# Patient Record
Sex: Female | Born: 1935 | Race: White | Hispanic: No | Smoking: Never smoker
Health system: Southern US, Community
[De-identification: ages and names within clinical notes are randomized; demographics above are authoritative.]

## PROBLEM LIST (undated history)

## (undated) DIAGNOSIS — F0390 Unspecified dementia without behavioral disturbance: Secondary | ICD-10-CM

---

## 2004-11-25 ENCOUNTER — Ambulatory Visit: Payer: Self-pay | Admitting: Internal Medicine

## 2005-01-21 ENCOUNTER — Encounter: Payer: Self-pay | Admitting: Internal Medicine

## 2005-01-31 ENCOUNTER — Encounter: Payer: Self-pay | Admitting: Internal Medicine

## 2005-03-03 ENCOUNTER — Encounter: Payer: Self-pay | Admitting: Internal Medicine

## 2005-04-03 ENCOUNTER — Encounter: Payer: Self-pay | Admitting: Internal Medicine

## 2007-06-27 ENCOUNTER — Other Ambulatory Visit: Payer: Self-pay

## 2007-06-27 ENCOUNTER — Emergency Department: Payer: Self-pay | Admitting: Emergency Medicine

## 2007-07-08 ENCOUNTER — Ambulatory Visit: Payer: Self-pay

## 2007-09-22 ENCOUNTER — Other Ambulatory Visit: Payer: Self-pay

## 2007-09-22 ENCOUNTER — Emergency Department: Payer: Self-pay | Admitting: Emergency Medicine

## 2008-01-05 ENCOUNTER — Ambulatory Visit: Payer: Self-pay | Admitting: Surgery

## 2008-01-11 ENCOUNTER — Ambulatory Visit: Payer: Self-pay | Admitting: Surgery

## 2008-03-02 ENCOUNTER — Other Ambulatory Visit: Payer: Self-pay

## 2008-03-02 ENCOUNTER — Emergency Department: Payer: Self-pay | Admitting: Emergency Medicine

## 2008-03-31 ENCOUNTER — Other Ambulatory Visit: Payer: Self-pay

## 2008-03-31 ENCOUNTER — Emergency Department: Payer: Self-pay | Admitting: Emergency Medicine

## 2014-09-03 ENCOUNTER — Ambulatory Visit: Payer: Self-pay | Admitting: Internal Medicine

## 2014-09-27 ENCOUNTER — Inpatient Hospital Stay: Payer: Self-pay | Admitting: Internal Medicine

## 2014-10-02 ENCOUNTER — Ambulatory Visit
Admit: 2014-10-02 | Disposition: A | Discharge status: Home / Self Care | Payer: Self-pay | Attending: Internal Medicine | Admitting: Internal Medicine

## 2014-12-02 NOTE — H&P (Signed)
PATIENT NAME:  Sherry Cherry, Sherry Cherry MR#:  [id number] DATE OF BIRTH:  January 08, 1917  DATE OF ADMISSION:  09/27/2014  PRIMARY CARE PHYSICIAN: Bethann Punches, MD     HOSPITAL COURSE: The patient is a 81 year old Caucasian female with past medical history significant for history of dementia, who presents to the hospital with complaints of low back pains. The patient herself has difficulty remembering a course of events; however, according to medical records, the patient was brought to the Emergency Room by her daughter who she lives with.  The patient has been having decreased activity level over the past 2 days and mostly spending time in bed because of significant low back pains.  In the Emergency Room, she was able to point out the lumbosacral area where the pain is.  A CT scan done in the Emergency Room showed old T12 fracture however, no significant other abnormalities. Her laboratories revealed pyuria, questionable urinary tract infection and CT scan of abdomen showed possible common bile duct stone with enlargement of intrahepatic ducts.  On measuring, they both measured 9 mm in dimension.  The patient's lab studies however did not show any significant abnormalities, normal liver function tests and lipase level is normal at 110.  Hospitalist services were contacted for admission.   PAST MEDICAL HISTORY: Significant for history of dementia, history of fibrocystic breast disease, constipation, history of arthritis, osteoarthritis,   PAST SURGICAL HISTORY: Right cataract removal in 1999, breast lump surgery in 1984, hernia repair in 2003, bilateral hernia repair in May 2004.   FAMILY HISTORY: No known history of coronary artery disease or diabetes.   ALLERGIES: No known drug allergies.   SOCIAL HISTORY: No smoking, alcohol abuse or illicit drug abuse. Lives at home with her daughter.   MEDICATIONS: According to medical records, the patient is on Acetaminopohen 325 mg 2 tablets orally as needed. Aricept 10 mg  p.o. at bedtime.   REVIEW OF SYSTEMS:  Difficult to obtain as the patient is demented and does not remember much, however denies any shortness of breath, chest pain, and abdominal discomfort, except in the lower part of abdomen and low back pains on.   On arrival to the hospital:  VITAL SIGNS: Temperature was 98.5, pulse was 71, respiration was 18, blood pressure 167/80, saturation was 98% on room air.  GENERAL: This is a well-developed, well-nourished Caucasian female in no significant distress, lying on the stretcher.  HEENT: Pupils are equal, reactive to light, extraocular muscles intact. No icterus or conjunctivitis, and had difficulty hearing. No pharyngeal erythema. Mucosa is moist.  NECK: No masses, nontender. Thyroid is not enlarged. No adenopathy. No JVD or carotid bruits bilaterally. Full range of motion.  LUNGS: Clear to auscultation anteriorly, no rales, rhonchi, diminished breath sounds or wheezing. No labored inspirations, increased effort, dullness to percussion, or overt respiratory distress.  CARDIOVASCULAR: S1, S2 appreciated. Rhythm is regular. PMI not lateralized. Chest is nontender to palpation. 1+ pedal edema. No lower extremity calf tenderness, or cyanosis.  ABDOMEN: Soft, minimal discomfort is noted in the right upper quadrant, but I am not absolutely sure about the discomfort since the patient is responding to simple abdominal pressure, grimacing intermittently.  There is no muscle guarding and no significant discomfort on palpation.  Some discomfort  was noted in the right lower quadrant as well as suprapubic, but no hepatosplenomegaly or masses were noted.  Bowel sounds were present.    RECTAL: Deferred.  MUSCLE STRENGTH: Able to move lower extremities; however, has difficulty even turning herself  in the bed from 1 side to the other side. She has significant discomfort and pain on palpation of her lumbosacral spine, especially in the lower part and lumbosacral area.  No  cyanosis, degenerative joint disease. Not able to assess for kyphosis.  SKIN: Did not reveal any rashes, lesions, erythema, nodularity, or induration. It was warm and dry to palpation.  LYMPHATIC: No adenopathy in the cervical region.  NEUROLOGICAL: Cranial nerves grossly intact. Sensory is intact. No dysarthria or aphasia. The patient is alert, oriented to person and place, cooperative.  Memory is significantly impaired but no agitation or confusion were noted. No depression.   LABORATORY DATA: Emergency Room showed BUN of 19, otherwise unremarkable. Albumin level of 3.0, otherwise liver enzymes were unremarkable. Lipase level was normal at 110. CBC within normal limits. White blood cell count level of 6.4, hemoglobin 13.5, platelet count 219,000. Neutrophil count is not checked.  The patient's urinalysis, yellow hazy urine, negative for glucose, bilirubin or ketones. Specific gravity 1.009, pH was 7.0, 2+ blood, negative for protein, no nitrites, 3+ leukocytes esterase, 25 red blood cells, 3 white blood cells, 1+ bacteria, 1 epithelial cell was noted.    RADIOLOGIC STUDIES: Chest x-ray was not done. Lumbar spine AP and lateral, 09/27/2014 revealed degenerative changes, mild wedge compression deformity in T12, vertebral body was noted which most likely represents old fracture, but the patient is symptomatic in this area.  CT scan would be recommended to evaluate for possible acute fracture.  The patient had CT scan of lumbar spine without contrast 09/27/2014, which showed multilevel degenerative changes and chronic compression deformity in T12.  MRI was recommended.  CT of abdomen and pelvis without contrast 09/27/2014 showed no renal calculi or septic changes. Rounded density was in the midportion of the common bile duct with associated biliary ductal dilatation noted which is consistent with common bile duct stone, which measured approximately 9 mm in dimension.   Some smaller stones were also noted in the  more distal common bile duct better visualized on coronal imaging.  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  Correlation of this patient's white blood cell count was recommended.   ASSESSMENT AND PLAN:  1.  Low back pain.  Admit the patient to medical floor and start her on Lidoderm, Norco, and morphine as needed.  We will get physical therapist's evaluation and get care management evaluation.  This patient is not able to take care of her herself.  She may need to be send to rehabilitation facility for short term rehabilitation.  2.  Urinary tract infection, suspected. We will initiate the patient on Rocephin IV.  We will get cultures.  3.  Common bile duct stone. Get gastrointestinal involved for recommendations.  Questionable ERCP.   4.  Dementia. Continue Aricept. 5.  Questionable diverticulitis. Continue the patient on Rocephin for now. The patient otherwise is asymptomatic.  6.  Mild dehydration, based on minimaly elevated BUN.  We will continue the patient on IV fluids.   TIME SPENT: 50 minutes on the patient.      ____________________________ Katharina Caperima Rekha Hobbins, MD rv:DT D: 09/27/2014 16:08:55 ET T: 09/27/2014 17:00:17 ET JOB#: [id number]  cc: Katharina Caperima Kenadi Miltner, MD, <Dictator> Danella PentonMark F. Miller, MD  Asbury Hair MD ELECTRONICALLY SIGNED 11/04/2014 12:57

## 2014-12-02 NOTE — Consult Note (Signed)
Pt came to ER for back/side pain lower right, far away from her liver.  Her CT scan showed a gall stone in the CBD and possibly some other small ones,  She has no symptoms suggsting any medical illness related to these stones.  No vomiting, nausea, RUQ pain and her LFT's are normal.  I briefly discussed this patient with Dr. Servando SnareWohl and at the age of 81 there would have to be very strong evidence of acute illness from these stones to justify an ERCP on her.  These may have been there for years.  We do not recommend any invasive manuvers at this time but would keep an eye on her LFT's and lipase while she is here.  I have discussed this with the patient and she is in complete agreement.  Please reconsult if clinical situation changes.  Electronic Signatures: Scot JunElliott, Iain Sawchuk T (MD)  (Signed on 25-Feb-16 18:56)  Authored  Last Updated: 25-Feb-16 18:56 by Scot JunElliott, Elgie Maziarz T (MD)

## 2014-12-02 NOTE — Discharge Summary (Signed)
PATIENT NAME:  Sherry Cherry, Sherry Cherry MR#:  [id number] DATE OF BIRTH:  04-26-1917  DATE OF ADMISSION:  09/27/2014 DATE OF DISCHARGE:  10/01/2014  DISCHARGE DIAGNOSES: 1.  T12 compression fracture, vertebral.  2.  Bile duct stone.  3.  Dementia, moderate, Alzheimer's type.  4.  Urinary tract infection.  DISCHARGE MEDICATIONS: Aricept 10 mg at bedtime, Tylenol 650 mg q. 4 p.r.n. pain,  Norco 10/325 one-half to 1 tab q. 6 hours p.r.n., lidocaine patch 5% topical daily, Cipro 250 mg b.i.Cherry. x1 week.   REASON FOR ADMISSION: A 81 year old female who presents with significant back pain, UTI. Please see H and P for HPI, past medical history and physical exam.   HOSPITAL COURSE: The patient was admitted, found to have a T12 vertebral compression fracture, as well as questionable CBD stone by CT. Her LFTs were normal. Her white count was normal. There was no sign of any obstructive jaundice or any need for GI intervention. Urine grew out E. coli and she was treated with p.o. Cipro. Her back pain was stabilized with the rare Norco use or just Tylenol and with physical therapy. With her progressive weakness, she will be going to skilled nursing.   ____________________________ Danella PentonMark F. Chizaram Latino, MD mfm:sb Cherry: 10/01/2014 09:36:14 ET T: 10/01/2014 09:42:52 ET JOB#: [id number]  cc: Danella PentonMark F. Anjela Cassara, MD, <Dictator> Meelah Tallo Sherlene ShamsF Tasharra Nodine MD ELECTRONICALLY SIGNED 10/02/2014 8:22

## 2014-12-02 NOTE — Consult Note (Signed)
No change in LFT's, no indication of any harmful effect of the probable stone.  Would repeat LFT's no more often than every 3 days while here then none at all as out patient unless clinical situation warrants it.  I will sign off, reconsult GI if needed.  Electronic Signatures: Scot JunElliott, Robert T (MD)  (Signed on 26-Feb-16 17:53)  Authored  Last Updated: 26-Feb-16 17:53 by Scot JunElliott, Robert T (MD)

## 2014-12-02 NOTE — Consult Note (Signed)
Chief Complaint:  Subjective/Chief Complaint Patient continues to c/o lower back pain, worse with standing and transfers   VITAL SIGNS/ANCILLARY NOTES: **Vital Signs.:   27-Feb-16 04:43  Temperature Temperature (F) 98.5  Pulse Pulse 65  Systolic BP Systolic BP 111  Diastolic BP (mmHg) Diastolic BP (mmHg) 64   Brief Assessment:  GEN Pleasant elderly female in NAD   Respiratory normal resp effort   Additional Physical Exam Mild focal tenderness over central lower back  NV intact to both lower extremities   Assessment/Plan:  Assessment/Plan:  Assessment LBP with lumbar spondylosis   Plan Mobilize with PT Treat UTI if present Manage pain with appropriate meds Consider MRI of L-spine if pain persists   Electronic Signatures: Derald MacleodPoggi, Jeffrey (MD)  (Signed 27-Feb-16 08:34)  Authored: Chief Complaint, VITAL SIGNS/ANCILLARY NOTES, Brief Assessment, Assessment/Plan   Last Updated: 27-Feb-16 08:34 by Derald MacleodPoggi, Jeffrey (MD)

## 2014-12-02 NOTE — Consult Note (Signed)
Sherry Cherry NAME:  Sherry Sherry Cherry, Sherry Sherry Cherry MR#:  [id number] DATE OF BIRTH:  05/01/1917  DATE OF CONSULTATION:  09/28/2014  REFERRING PHYSICIAN:  Daniel NonesBert Klein, MD CONSULTING PHYSICIAN:  Maryagnes AmosJ. Jeffrey Kainalu Heggs, MD  REASON FOR CONSULTATION: I have been asked by Dr. Graciela HusbandsKlein to evaluate this unfortunate woman for lower back pain.   HISTORY OF PRESENT ILLNESS: Briefly, she is a 81 year old female with history of dementia who presented to [facility] hospital complaining of lower back pain yesterday. She apparently lives with her daughter and is not a good historian herself. She cannot recall any specific injury or fall. Sherry Sherry Cherry notes Sherry pain is localized to Sherry lower back region. She denies any numbness or paraesthesias of either lower extremity.   PHYSICAL EXAMINATION: We have a pleasant, elderly female sitting comfortably in a chair while eating lunch. She is appropriately responsive to questions, but does not recall any past events so cannot provide any accurate history as to whether she may have fallen or not. Orthopedic examination is limited to her back and lower extremity. Skin inspection of her back is unremarkable. She has mild tenderness to palpation over Sherry central lumbar region, but has no tenderness over Sherry thoracolumbar junction, over Sherry paraspinal muscles, or over either SI joint. Her gait is not assessed nor is her ability to stand up from a seated position, given Sherry fact that she is in a chair and eating lunch. She demonstrates good strength with ankle dorsiflexion, plantar flexion, inversion, and eversion. She also has good toe extensor strength. She has intact sensation to light touch to both lower extremities and feet. She has good capillary refill to both feet.   DIAGNOSTIC DATA: X-rays of her lumbar spine are available for review. These films demonstrate what appears to be an old mild compression fracture at T12, as well as significant degenerative changes at L1-L2 and lesser degenerative changes at Sherry  remaining levels. There is a mild retrolisthesis of L2 on L3, and a mild anterolisthesis of L3 on L4 and of L4 on L5. No lytic lesions or fractures are identified.   A CT scan of Sherry abdomen also is available for review. Sherry CT scan demonstrated similar findings pertaining to her lumbar spine as did Sherry plain radiographs.   IMPRESSION: Lumbar degenerative disk disease with no apparent acute compression fracture or other orthopedic abnormality.   PLAN: Sherry treatment options have been discussed with Sherry Sherry Cherry. At this point, I wonder whether her back pain may be due to a urinary tract infection. She also does have some chronic lumbar degenerative changes. I would consider mobilizing her with physical therapy and see how well she tolerates this. If she is unable to tolerate physical therapy despite adequate treatment of her urinary tract infection, then a MRI scan of her lumbar spine would be indicated to evaluate for an occult fracture.   I thank you for asking me to participate in Sherry care of this most pleasant woman. I will be happy to follow her with you.  ____________________________ J. Derald MacleodJeffrey Charistopher Rumble, MD jjp:sb Cherry: 09/28/2014 13:22:47 ET T: 09/28/2014 16:40:49 ET JOB#: [id number]  cc: Maryagnes AmosJ. Jeffrey Latroy Gaymon, MD, <Dictator> Maryagnes AmosJ. JEFFREY Takela Varden MD ELECTRONICALLY SIGNED 10/02/2014 11:45

## 2014-12-02 NOTE — Consult Note (Signed)
Chief Complaint:  Subjective/Chief Complaint Patient sleeping comfortably, so not awakened. Has not been officially evauated by PT yet, but nursing staff notes that she is quite difficult to mobilize for transfers due to LBP.   Assessment/Plan:  Assessment/Plan:  Assessment LBP with underlying spondylosis and T12 compression fracture of indeterminate age.   Plan Awaiting PT evaluation (probably Monday AM). Consider MRI of L-spine if pain continues to inhibit mobilization of patient.   Electronic Signatures: Derald MacleodPoggi, Jeffrey (MD)  (Signed 28-Feb-16 11:38)  Authored: Chief Complaint, Assessment/Plan   Last Updated: 28-Feb-16 11:38 by Derald MacleodPoggi, Jeffrey (MD)

## 2014-12-02 NOTE — Consult Note (Signed)
   Comments   Telephone call from patient's daughter, Warren DanesCarol Giuliani.stated she likely won't be able to continue to care the patient at home.  Okey RegalCarol works during the day.  A private caregiver stays with the patient 4 hours a day five days a week while Okey RegalCarol is at work.  Okey RegalCarol said that if the patient needs a higher level of care, the patient may need to go to a rehab facility. stated she is the POA.  She said her deceased brother-in-law was the HCPOA.  A new HCPOA was not written after his death.  Okey RegalCarol stated she believes the patient had a formal Living Will.  She will try to find  the Living Will and bring a copy to the hospital for the chart.  Discussed that the patient will continue to receive medications for her pain over the weekend.  The NP or SW will touch base with Okey Regalarol on Monday. cell phone number: [phone]   She is unable to receive a cell phone signal when she is at home this weekend.  Her home number is [phone] and the best contact number over the weekend.  Electronic Signatures: Phifer, Harriett SineNancy (MD)  (Signed [phone]-Feb-16 16:47)  Authored: Palliative Care Reginal Lutesowe, Jabir Dahlem L (NP)  (Signed 26-Feb-16 16:35)  Authored: Palliative Care   Last Updated: 26-Feb-16 16:47 by Phifer, Harriett SineNancy (MD)

## 2014-12-02 NOTE — Consult Note (Signed)
PATIENT NAME:  Sherry Cherry, Sherry Cherry MR#:  [id number] DATE OF BIRTH:  05/05/1917  DATE OF CONSULTATION:  09/27/2014  REFERRING PHYSICIAN:   CONSULTING PHYSICIAN:  Scot Junobert T. Elliott, MD  HISTORY OF PRESENT ILLNESS: The patient is a 81 year old white female who presented to the hospital complaining of low back pain. She has had decreased activity level over the last 2 days, spending most of her time in bed because of the back pain. Ah CAT scan of the abdomen was done showing old T12 fractures;  no other significant abnormalities. Her labs did show pyuria. CAT scan showed possible common bile duct stone with enlargement of the intrahepatic ducts of 9 mm. Her lipase was normal. She was admitted and GI was asked to see her.   PAST MEDICAL HISTORY: Obtained only from the patient. Family members not round at this time and also obtained from the chart. History of dementia, constipation, arthritis, previous cataract surgery, breast lump surgery, hernia repair 2003 and 2004.   FAMILY HISTORY: No known liver disease.   ALLERGIES: No known drug allergies.   SOCIAL HISTORY: Lives with her daughter.  MEDICATIONS: Aricept 10 mg at bedtime.   PHYSICAL EXAMINATION: GENERAL: Elderly white female in no acute distress.  HEENT: Sclerae anicteric. Conjunctivae negative.  HEAD: Atraumatic.  CHEST: Clear to anterior fields.  HEART: No murmurs or gallops.  ABDOMEN: No significant tenderness to palpation. No hepatosplenomegaly. No masses.   LABORATORY DATA: Shows lipase of 110. White count 6.4. Her liver functions are completely normal except albumin slightly low at 3. Total bilirubin 0.5, alkaline phosphatase 90, SGOT 26, SGPT 16. White blood count 6.4, platelet count is 219,000.   ASSESSMENT: There is no evidence that these stones are causing any pathological problems at this time. Given her absence of pain, jaundice, vomiting, fever or elevated liver functions or lipase, these stones may have been there for a  significant amount of time.   Given the absence of clinical manifestations of illness related to the stones, no intervention should be done at this time. Because of her age, she would be at significant risk for complications of an ERCP, which would have to be done under general anesthesia. If the patient manifests changes to suggest acute illness related to stones, this decision can be revisited, but at this time, would recommend simply observe the patient. She is in complete agreement. The consult was discussed with Dr. Servando SnareWohl who does ERCPs.    ____________________________ Scot Junobert T. Elliott, MD rte:dw Cherry: 09/27/2014 19:02:12 ET T: 09/27/2014 19:32:07 ET JOB#: [id number]  cc: Scot Junobert T. Elliott, MD, <Dictator> Danella PentonMark F. Miller, MD Scot JunOBERT T ELLIOTT MD ELECTRONICALLY SIGNED 10/06/2014 11:00

## 2014-12-02 NOTE — Consult Note (Signed)
Brief Consult Note: Diagnosis: LBP with underlying spondylosis.   Patient was seen by consultant.   Consult note dictated.   Recommend further assessment or treatment.   Comments: Given the patient's exam and x-ray findings, I do not feel that the T12 compressino fracture is new. Would recommend mobilizing with PT.  If patient cannot tolerate PT, then would consider MRI of lumbar spine to evaluate for occult fracture.  Thanks!.  Electronic Signatures: Derald MacleodPoggi, Jeffrey (MD)  (Signed [phone]-Feb-16 13:16)  Authored: Brief Consult Note   Last Updated: 26-Feb-16 13:16 by Derald MacleodPoggi, Jeffrey (MD)

## 2016-11-09 ENCOUNTER — Emergency Department: Payer: Medicare Other

## 2016-11-09 ENCOUNTER — Inpatient Hospital Stay
Admission: EM | Admit: 2016-11-09 | Discharge: 2016-11-13 | DRG: 065 | Disposition: A | Discharge status: Hospice | Payer: Medicare Other | Attending: Internal Medicine | Admitting: Internal Medicine

## 2016-11-09 DIAGNOSIS — F809 Developmental disorder of speech and language, unspecified: Secondary | ICD-10-CM | POA: Diagnosis not present

## 2016-11-09 DIAGNOSIS — Z66 Do not resuscitate: Secondary | ICD-10-CM | POA: Diagnosis present

## 2016-11-09 DIAGNOSIS — Y92092 Bedroom in other non-institutional residence as the place of occurrence of the external cause: Secondary | ICD-10-CM

## 2016-11-09 DIAGNOSIS — S2249XA Multiple fractures of ribs, unspecified side, initial encounter for closed fracture: Secondary | ICD-10-CM | POA: Diagnosis present

## 2016-11-09 DIAGNOSIS — I639 Cerebral infarction, unspecified: Principal | ICD-10-CM | POA: Diagnosis present

## 2016-11-09 DIAGNOSIS — Z7189 Other specified counseling: Secondary | ICD-10-CM

## 2016-11-09 DIAGNOSIS — R479 Unspecified speech disturbances: Secondary | ICD-10-CM

## 2016-11-09 DIAGNOSIS — R5383 Other fatigue: Secondary | ICD-10-CM | POA: Diagnosis present

## 2016-11-09 DIAGNOSIS — R4701 Aphasia: Secondary | ICD-10-CM | POA: Diagnosis present

## 2016-11-09 DIAGNOSIS — F0150 Vascular dementia without behavioral disturbance: Secondary | ICD-10-CM | POA: Diagnosis present

## 2016-11-09 DIAGNOSIS — J811 Chronic pulmonary edema: Secondary | ICD-10-CM | POA: Diagnosis present

## 2016-11-09 DIAGNOSIS — I739 Peripheral vascular disease, unspecified: Secondary | ICD-10-CM | POA: Diagnosis present

## 2016-11-09 DIAGNOSIS — Z79899 Other long term (current) drug therapy: Secondary | ICD-10-CM

## 2016-11-09 DIAGNOSIS — Z515 Encounter for palliative care: Secondary | ICD-10-CM | POA: Diagnosis present

## 2016-11-09 DIAGNOSIS — R29810 Facial weakness: Secondary | ICD-10-CM | POA: Diagnosis present

## 2016-11-09 DIAGNOSIS — B379 Candidiasis, unspecified: Secondary | ICD-10-CM | POA: Diagnosis present

## 2016-11-09 DIAGNOSIS — G9389 Other specified disorders of brain: Secondary | ICD-10-CM | POA: Diagnosis present

## 2016-11-09 DIAGNOSIS — W06XXXA Fall from bed, initial encounter: Secondary | ICD-10-CM | POA: Diagnosis present

## 2016-11-09 DIAGNOSIS — Z881 Allergy status to other antibiotic agents status: Secondary | ICD-10-CM

## 2016-11-09 DIAGNOSIS — I509 Heart failure, unspecified: Secondary | ICD-10-CM | POA: Diagnosis present

## 2016-11-09 DIAGNOSIS — R4182 Altered mental status, unspecified: Secondary | ICD-10-CM

## 2016-11-09 HISTORY — DX: Unspecified dementia, unspecified severity, without behavioral disturbance, psychotic disturbance, mood disturbance, and anxiety: F03.90

## 2016-11-09 LAB — COMPREHENSIVE METABOLIC PANEL
ALBUMIN: 2.8 g/dL — AB (ref 3.5–5.0)
ALK PHOS: 85 U/L (ref 38–126)
ALT: 30 U/L (ref 14–54)
ANION GAP: 6 (ref 5–15)
AST: 14 U/L — ABNORMAL LOW (ref 15–41)
BILIRUBIN TOTAL: 0.7 mg/dL (ref 0.3–1.2)
BUN: 16 mg/dL (ref 6–20)
CALCIUM: 8.3 mg/dL — AB (ref 8.9–10.3)
CO2: 23 mmol/L (ref 22–32)
Chloride: 101 mmol/L (ref 101–111)
Creatinine, Ser: 0.66 mg/dL (ref 0.44–1.00)
GFR calc Af Amer: >60 mL/min (ref >60)
GLUCOSE: 96 mg/dL (ref 65–99)
Potassium: 3.5 mmol/L (ref 3.5–5.1)
Sodium: 130 mmol/L — ABNORMAL LOW (ref 135–145)
TOTAL PROTEIN: 6.1 g/dL — AB (ref 6.5–8.1)

## 2016-11-09 LAB — CBC
HCT: 36.3 % (ref 35.0–47.0)
Hemoglobin: 12.1 g/dL (ref 12.0–16.0)
MCH: 28.3 pg (ref 26.0–34.0)
MCHC: 33.3 g/dL (ref 32.0–36.0)
MCV: 84.9 fL (ref 80.0–100.0)
Platelets: 218 10*3/uL (ref 150–440)
RBC: 4.28 MIL/uL (ref 3.80–5.20)
RDW: 16.6 % — AB (ref 11.5–14.5)
WBC: 7.7 10*3/uL (ref 3.6–11.0)

## 2016-11-09 NOTE — ED Provider Notes (Signed)
[facility] Emergency Department Provider Note  Time seen: 10:16 PM  I have reviewed the triage vital signs and the nursing notes.   HISTORY  Chief Complaint Altered Mental Status    HPI Sherry Cherry is a 83 y.o. female with a past medical history of dementia who presents to the emergency department for decreased responsiveness. Per EMS they state the patient has baseline dementia difficult following commands. They state at the nursing home tonight they noted the patient to be less responsive than normal so they sent her to the emergency department for evaluation. Currently the patient's family is here he states the patient is normally more talkative and gets agitated much more easily. They state the patient has baseline dementia and would not be able to tell them who they are or where she was even at baseline. They state at baseline she will communicate when she feels like it and at times she will not communicate because she does not feel like it. States she gets agitated very easily. For instance I tried to listen to her chest and she got agitated pulling the covers back over herself raising her arms to try to hit or defend herself. They state this is fairly normal for her. They are unaware of any specific complaints such as cough congestion vomiting or diarrhea. Patient is afebrile upon arrival. She does have old bruising to the face. Family states she fell a little over a week ago but was never taken for evaluation but was seen by the nursing home nurse.  No past medical history on file.  There are no active problems to display for this patient.   No past surgical history on file.  Prior to Admission medications   Medication Sig Start Date End Date Taking? Authorizing Provider  acetaminophen (TYLENOL) 325 MG tablet Take 325 mg by mouth every 6 (six) hours as needed.   Yes Historical Provider, MD  diclofenac sodium (VOLTAREN) 1 % GEL Apply 2 g topically at  bedtime.   Yes Historical Provider, MD  sertraline (ZOLOFT) 25 MG tablet Take 25 mg by mouth daily.   Yes Historical Provider, MD    Allergies  Allergen Reactions  . Macrobid [Nitrofurantoin Macrocrystal]     No family history on file.  Social History Social History  Substance Use Topics  . Smoking status: Not on file  . Smokeless tobacco: Not on file  . Alcohol use Not on file    Review of Systems Unable to obtain a review of systems due to significant dementia.  ____________________________________________   PHYSICAL EXAM:  VITAL SIGNS: ED Triage Vitals  Enc Vitals Group     BP 11/09/16 2135 140/83     Pulse Rate 11/09/16 2135 74     Resp 11/09/16 2135 14     Temp 11/09/16 2135 97.8 F (36.6 C)     Temp Source 11/09/16 2135 Oral     SpO2 11/09/16 2135 95 %     Weight 11/09/16 2137 122 lb 6.4 oz (55.5 kg)     Height 11/09/16 2137  (1.626 m)     Head Circumference --      Peak Flow --      Pain Score --      Pain Loc --      Pain Edu? --      Excl. in GC? --     Constitutional: Alert. Patient will open her eyes to voice but does not follow commands. She does move  all extremities. Gets agitated with examination. Family states this is fairly baseline for her. Eyes: Normal exam ENT   Head: Normocephalic and atraumatic   Mouth/Throat: Mucous membranes are moist. Cardiovascular: Normal rate, regular rhythm.  Respiratory: Normal respiratory effort without tachypnea nor retractions. Breath sounds are clear Gastrointestinal: Soft and nontender. No distention.  No reaction to abdominal palpation Musculoskeletal: Nontender with normal range of motion in all extremities. Normal range of motion in lower extremities. Neurologic:  Patient is less talkative than normal per family. Moves all extremities normally. Skin:  Skin is warm, dry and intact.   ____________________________________________   RADIOLOGY  IMPRESSION: 1. Cardiomegaly with aortic  atherosclerosis and mild interstitial edema. Trace left pleural effusion with blunting of the left lateral costophrenic angle. 2. Subtle deformity of the right lateral fifth through seventh ribs may reflect nondisplaced rib fractures possibly recent given adjacent atelectasis. These are new since the 2009 comparison.  IMPRESSION: 1. 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 2. Chronic stable ovoid hyperdensity likely representing benign calcification in the left anterior insular cortex. 3. Chronic small vessel ischemic disease of periventricular white matter with atrophy. 4. No acute intracranial abnormality.    ____________________________________________   INITIAL IMPRESSION / ASSESSMENT AND PLAN / ED COURSE  Pertinent labs & imaging results that were available during my care of the patient were reviewed by me and considered in my medical decision making (see chart for details).  The patient presents to the emergency department for decreased responsiveness. Family states she is less talkative than normal but otherwise is acting fairly normal for them. Patient gets agitated during the examination which they state is normal.  Patient does have old bruising to the right side of her face which they states from a fall greater than 1 week ago. We will obtain a CT scan of the head to rule out delayed subdural. We will check labs, urinalysis as well as a chest x-ray.  ____________________________________________   FINAL CLINICAL IMPRESSION(S) / ED DIAGNOSES  Altered mental status    Minna Antis, MD 11/13/16 1512

## 2016-11-09 NOTE — ED Triage Notes (Addendum)
Pt presents to ED 10 via EMS from [facility]; per EMS pt's baseline is severe dementia, combative, non-verbal, non-responsive, ambulatory w/ walker; VS per EMS, NSR, HR 80s, BP 136/84, CBG 106; EMS were called because pt had not ambulated for the last 45 minutes. At this time, pt is non-verbal, non-responsive to commands; pt does not appear to be in acute distress; pt does have bruising on right side of face; pt's O2 sats were noted to be in the low 90s when pt arrived, pt placed on 2L O2 via [place], O2 sats improved to 95%. Unable to obtain medical history, family history, smoking or drug use status, and allergy information. According to paperwork provided by EMS from [facility], pt is allergic to Macrobid; pt takes Diclofenac 1% gel, Mapap 325 mg tab, and Sertraline 25 mg tab; pt has DNR order as well.

## 2016-11-10 ENCOUNTER — Inpatient Hospital Stay: Payer: Medicare Other

## 2016-11-10 ENCOUNTER — Encounter: Payer: Self-pay | Admitting: Internal Medicine

## 2016-11-10 DIAGNOSIS — Z7189 Other specified counseling: Secondary | ICD-10-CM | POA: Diagnosis not present

## 2016-11-10 DIAGNOSIS — R4701 Aphasia: Secondary | ICD-10-CM | POA: Diagnosis present

## 2016-11-10 DIAGNOSIS — R5383 Other fatigue: Secondary | ICD-10-CM | POA: Diagnosis not present

## 2016-11-10 DIAGNOSIS — I639 Cerebral infarction, unspecified: Secondary | ICD-10-CM | POA: Diagnosis not present

## 2016-11-10 DIAGNOSIS — F809 Developmental disorder of speech and language, unspecified: Secondary | ICD-10-CM | POA: Diagnosis present

## 2016-11-10 DIAGNOSIS — Z881 Allergy status to other antibiotic agents status: Secondary | ICD-10-CM | POA: Diagnosis not present

## 2016-11-10 DIAGNOSIS — J811 Chronic pulmonary edema: Secondary | ICD-10-CM | POA: Diagnosis present

## 2016-11-10 DIAGNOSIS — Z79899 Other long term (current) drug therapy: Secondary | ICD-10-CM | POA: Diagnosis not present

## 2016-11-10 DIAGNOSIS — I739 Peripheral vascular disease, unspecified: Secondary | ICD-10-CM | POA: Diagnosis present

## 2016-11-10 DIAGNOSIS — B379 Candidiasis, unspecified: Secondary | ICD-10-CM | POA: Diagnosis present

## 2016-11-10 DIAGNOSIS — F0150 Vascular dementia without behavioral disturbance: Secondary | ICD-10-CM | POA: Diagnosis not present

## 2016-11-10 DIAGNOSIS — R29810 Facial weakness: Secondary | ICD-10-CM | POA: Diagnosis present

## 2016-11-10 DIAGNOSIS — W06XXXA Fall from bed, initial encounter: Secondary | ICD-10-CM | POA: Diagnosis present

## 2016-11-10 DIAGNOSIS — R4182 Altered mental status, unspecified: Secondary | ICD-10-CM | POA: Diagnosis not present

## 2016-11-10 DIAGNOSIS — Y92092 Bedroom in other non-institutional residence as the place of occurrence of the external cause: Secondary | ICD-10-CM | POA: Diagnosis not present

## 2016-11-10 DIAGNOSIS — Z66 Do not resuscitate: Secondary | ICD-10-CM | POA: Diagnosis present

## 2016-11-10 DIAGNOSIS — I509 Heart failure, unspecified: Secondary | ICD-10-CM | POA: Diagnosis present

## 2016-11-10 DIAGNOSIS — Z515 Encounter for palliative care: Secondary | ICD-10-CM | POA: Diagnosis not present

## 2016-11-10 DIAGNOSIS — S2249XA Multiple fractures of ribs, unspecified side, initial encounter for closed fracture: Secondary | ICD-10-CM | POA: Diagnosis present

## 2016-11-10 DIAGNOSIS — G9389 Other specified disorders of brain: Secondary | ICD-10-CM | POA: Diagnosis present

## 2016-11-10 LAB — LIPID PANEL
Cholesterol: 155 mg/dL (ref 0–200)
HDL: 52 mg/dL (ref >40)
LDL Cholesterol: 93 mg/dL (ref 0–99)
Total CHOL/HDL Ratio: 3 RATIO
Triglycerides: 52 mg/dL (ref <150)
VLDL: 10 mg/dL (ref 0–40)

## 2016-11-10 LAB — URINALYSIS, COMPLETE (UACMP) WITH MICROSCOPIC
BACTERIA UA: NONE SEEN
BILIRUBIN URINE: NEGATIVE
Glucose, UA: NEGATIVE mg/dL
HGB URINE DIPSTICK: NEGATIVE
KETONES UR: NEGATIVE mg/dL
LEUKOCYTES UA: NEGATIVE
NITRITE: NEGATIVE
PROTEIN: NEGATIVE mg/dL
SPECIFIC GRAVITY, URINE: 1.005 (ref 1.005–1.030)
SQUAMOUS EPITHELIAL / LPF: NONE SEEN
pH: 7 (ref 5.0–8.0)

## 2016-11-10 LAB — TSH: TSH: 2.691 u[IU]/mL (ref 0.350–4.500)

## 2016-11-10 LAB — MRSA PCR SCREENING: MRSA BY PCR: NEGATIVE

## 2016-11-10 MED ORDER — STROKE: EARLY STAGES OF RECOVERY BOOK
Freq: Once | Status: AC
  Administered 2016-11-10: 10:00:00

## 2016-11-10 MED ORDER — ONDANSETRON HCL 4 MG PO TABS: 4.0000 mg | ORAL_TABLET | Freq: Four times a day (QID) | ORAL | Status: DC | PRN

## 2016-11-10 MED ORDER — ASPIRIN 300 MG RE SUPP
300.0000 mg | Freq: Every day | RECTAL | Status: DC
  Administered 2016-11-10 – 2016-11-11 (×2): 300 mg via RECTAL
  Filled 2016-11-10 (×2): qty 1

## 2016-11-10 MED ORDER — ASPIRIN 325 MG PO TABS: 325.0000 mg | ORAL_TABLET | Freq: Every day | ORAL | Status: DC

## 2016-11-10 MED ORDER — ORAL CARE MOUTH RINSE
15.0000 mL | Freq: Two times a day (BID) | OROMUCOSAL | Status: DC
  Administered 2016-11-10 – 2016-11-12 (×4): 15 mL via OROMUCOSAL

## 2016-11-10 MED ORDER — FUROSEMIDE 10 MG/ML IJ SOLN
20.0000 mg | Freq: Once | INTRAMUSCULAR | Status: AC
  Administered 2016-11-10: 20 mg via INTRAVENOUS
  Filled 2016-11-10: qty 4

## 2016-11-10 MED ORDER — OXYCODONE-ACETAMINOPHEN 5-325 MG PO TABS: 1.0000 | ORAL_TABLET | Freq: Four times a day (QID) | ORAL | Status: DC | PRN

## 2016-11-10 MED ORDER — ACETAMINOPHEN 325 MG PO TABS
650.0000 mg | ORAL_TABLET | Freq: Four times a day (QID) | ORAL | Status: DC | PRN
Start: 2016-11-10 — End: 2016-11-13

## 2016-11-10 MED ORDER — SERTRALINE HCL 25 MG PO TABS: 25.0000 mg | ORAL_TABLET | Freq: Every day | ORAL | Status: DC

## 2016-11-10 MED ORDER — ENOXAPARIN SODIUM 40 MG/0.4ML ~~LOC~~ SOLN
40.0000 mg | SUBCUTANEOUS | Status: DC
  Administered 2016-11-10: 40 mg via SUBCUTANEOUS
  Filled 2016-11-10: qty 0.4

## 2016-11-10 MED ORDER — SODIUM CHLORIDE 0.9 % IV SOLN
INTRAVENOUS | Status: DC
  Administered 2016-11-10 – 2016-11-11 (×2): via INTRAVENOUS

## 2016-11-10 MED ORDER — ONDANSETRON HCL 4 MG/2ML IJ SOLN: 4.0000 mg | Freq: Four times a day (QID) | INTRAMUSCULAR | Status: DC | PRN

## 2016-11-10 MED ORDER — MORPHINE SULFATE (PF) 2 MG/ML IV SOLN: 1.0000 mg | INTRAVENOUS | Status: DC | PRN

## 2016-11-10 MED ORDER — DOCUSATE SODIUM 100 MG PO CAPS: 100.0000 mg | ORAL_CAPSULE | Freq: Two times a day (BID) | ORAL | Status: DC

## 2016-11-10 MED ORDER — ACETAMINOPHEN 650 MG RE SUPP: 650.0000 mg | Freq: Four times a day (QID) | RECTAL | Status: DC | PRN

## 2016-11-10 NOTE — Progress Notes (Signed)
Speech Therapy Note: BSE revealed pt has significantly declined Cognitive status and overall decreased awareness for task of swallowing. This greatly increases her risk for aspiration w/ any oral intake.  Recommend continue NPO status w/ alternative means for any medications(ie IV). ST services will f/u tomorrow AM for ongoing assessment of appropriateness/safety of oral intake. NSG updated on pt's status and recommendations; agreed.   Jerilynn Som, MS, CCC-SLP

## 2016-11-10 NOTE — H&P (Addendum)
Sherry Cherry is an 83 y.o. female.   Chief Complaint: Altered mental status HPI: The patient with past medical history of dementia presents to the emergency department due to decreased responsiveness. The patient lives at assisted living facility [facility]. She usually ambulatory walker and is talkative with family and staff members. However, today after supper she seemed lethargic and noncommunicative. During evaluation in the emergency department the patient became more alert and uncooperative during attempts for bladder catheterization but still only wants to curl up in bed when not being bothered. CT of her head showed old changes including a remote cerebellar stroke but no new abnormalities. Nursing home staff and family deny any recent illness and stated the patient is usually in good health overall. Due to her acute change in mental status the emergency department staff called the hospitalist service for admission.  Past Medical History:  Diagnosis Date  . Dementia     No past surgical history on file. None  Family History  Problem Relation Age of Onset  . Hypertension Other    Social History:  has no tobacco, alcohol, and drug history on file.  Allergies:  Allergies  Allergen Reactions  . Macrobid [Nitrofurantoin Macrocrystal]     Prior to Admission medications   Medication Sig Start Date End Date Taking? Authorizing Provider  acetaminophen (TYLENOL) 325 MG tablet Take 325 mg by mouth every 6 (six) hours as needed.   Yes Historical Provider, MD  diclofenac sodium (VOLTAREN) 1 % GEL Apply 2 g topically at bedtime.   Yes Historical Provider, MD  sertraline (ZOLOFT) 25 MG tablet Take 25 mg by mouth daily.   Yes Historical Provider, MD     Results for orders placed or performed during the hospital encounter of 11/09/16 (from the past 48 hour(s))  CBC     Status: Abnormal   Collection Time: 11/09/16 10:47 PM  Result Value Ref Range   WBC 7.7 3.6 - 11.0 K/uL   RBC 4.28  3.80 - 5.20 MIL/uL   Hemoglobin 12.1 12.0 - 16.0 g/dL   HCT 36.3 35.0 - 47.0 %   MCV 84.9 80.0 - 100.0 fL   MCH 28.3 26.0 - 34.0 pg   MCHC 33.3 32.0 - 36.0 g/dL   RDW 16.6 (H) 11.5 - 14.5 %   Platelets 218 150 - 440 K/uL  Comprehensive metabolic panel     Status: Abnormal   Collection Time: 11/09/16 10:47 PM  Result Value Ref Range   Sodium 130 (L) 135 - 145 mmol/L   Potassium 3.5 3.5 - 5.1 mmol/L   Chloride 101 101 - 111 mmol/L   CO2 23 22 - 32 mmol/L   Glucose, Bld 96 65 - 99 mg/dL   BUN 16 6 - 20 mg/dL   Creatinine, Ser 0.66 0.44 - 1.00 mg/dL   Calcium 8.3 (L) 8.9 - 10.3 mg/dL   Total Protein 6.1 (L) 6.5 - 8.1 g/dL   Albumin 2.8 (L) 3.5 - 5.0 g/dL   AST 14 (L) 15 - 41 U/L   ALT 30 14 - 54 U/L   Alkaline Phosphatase 85 38 - 126 U/L   Total Bilirubin 0.7 0.3 - 1.2 mg/dL   GFR calc non Af Amer >60 >60 mL/min   GFR calc Af Amer >60 >60 mL/min    Comment: (NOTE) The eGFR has been calculated using the CKD EPI equation. This calculation has not been validated in all clinical situations. eGFR's persistently <60 mL/min signify possible Chronic Kidney Disease.  Anion gap 6 5 - 15   Ct Head Wo Contrast  Result Date: 11/10/2016 CLINICAL DATA:  Dementia, recent fall. EXAM: CT HEAD WITHOUT CONTRAST TECHNIQUE: Contiguous axial images were obtained from the base of the skull through the vertex without intravenous contrast. COMPARISON:  03/02/2008 CT FINDINGS: Brain: Chronic stable ovoid hyperdensity dating back to 2009 consistent with a benign finding, likely a focus of calcification in the left anterior insular cortex. Left inferior cerebellar encephalomalacia from chronic infarction. No acute intracranial hemorrhage, midline shift or edema. Superficial and central atrophy. Chronic small vessel ischemic disease of periventricular white matter. No extra-axial fluid collections. Vascular: Atherosclerosis of the carotid siphons bilaterally and left vertebral artery. No hyperdense vessels.  Skull: Normal. Negative for fracture or focal lesion. Clear mastoids. Sinuses/Orbits: Clear paranasal sinuses. Status post right lens replacement. Intact globes and orbits bilaterally. Other: None IMPRESSION: 1. 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 2. Chronic stable ovoid hyperdensity likely representing benign calcification in the left anterior insular cortex. 3. Chronic small vessel ischemic disease of periventricular white matter with atrophy. 4. No acute intracranial abnormality. Electronically Signed   By: Ashley Royalty M.D.   On: 11/10/2016 00:16   Dg Chest Portable 1 View  Result Date: 11/09/2016 CLINICAL DATA:  Severe dementia, recent fall. EXAM: PORTABLE CHEST 1 VIEW COMPARISON:  03/31/2008 CXR, 11/25/2004 chest CT FINDINGS: Stable cardiomegaly with aortic atherosclerosis. Mild interstitial edema is noted bilaterally with small focus of pulmonary airspace disease and atelectasis at the right lung base laterally adjacent to several rib fractures involving the right fifth through seventh ribs. No pneumothorax. Minimal blunting of left costophrenic and pole consistent with a small left effusion. Soft tissue density along the right paratracheal portion of the mediastinum may represent an engorged azygos vein. Adenopathy is not entirely excluded. IMPRESSION: 1. Cardiomegaly with aortic atherosclerosis and mild interstitial edema. Trace left pleural effusion with blunting of the left lateral costophrenic angle. 2. Subtle deformity of the right lateral fifth through seventh ribs may reflect nondisplaced rib fractures possibly recent given adjacent atelectasis. These are new since the 2009 comparison. Electronically Signed   By: Ashley Royalty M.D.   On: 11/09/2016 22:50    Review of Systems  Unable to perform ROS: Patient nonverbal    Blood pressure 126/81, pulse 70, temperature 97.8 F (36.6 C), temperature source Oral, resp. rate 14, height 5' 4"  (1.626 m), weight 55.5 kg (122  lb 6.4 oz), SpO2 96 %. Physical Exam  Vitals reviewed. Constitutional: She is oriented to person, place, and time. She appears well-developed and well-nourished. No distress.  HENT:  Head: Normocephalic and atraumatic.  Mouth/Throat: Oropharynx is clear and moist.  Eyes: Conjunctivae and EOM are normal. Pupils are equal, round, and reactive to light.  Neck: Normal range of motion. Neck supple. No JVD present. No tracheal deviation present. No thyromegaly present.  Cardiovascular: Normal rate, regular rhythm and normal heart sounds.  Exam reveals no gallop and no friction rub.   No murmur heard. Respiratory: Effort normal and breath sounds normal.  GI: Soft. Bowel sounds are normal. She exhibits no distension. There is no tenderness.  Genitourinary:  Genitourinary Comments: Deferred  Musculoskeletal: Normal range of motion. She exhibits no edema.  Lymphadenopathy:    She has no cervical adenopathy.  Neurological: She is alert and oriented to person, place, and time. No cranial nerve deficit. She exhibits normal muscle tone.  Skin: Skin is warm and dry. No rash noted. No erythema.  Psychiatric: She has a normal  mood and affect. Her behavior is normal. Judgment and thought content normal.     Assessment/Plan This is a 83 year old female admitted for lethargy. 1. Acute mental status: Lethargy; the patient only shows self initiated movement when agitated. She is trying to mouth words at this point but still is nonverbal. Check for reversible causes of delirium. Rule out TIA/stroke. Obtain echocardiogram and MRI. This could be a stepwise decline in her dementia. Consult neurology. Swallow screen prior to ordering diet. 2. Dementia: With behavioral disturbance. This is baseline for the patient. Continue Zoloft. 3. Pulmonary edema: Mild interstitial edema. I've given the patient Lasix 20 mg IV in the emergency department. Monitor urine output. The patient has no history of CHF. Review  echocardiogram results 4. Rib fractures: Nondisplaced; presumably secondary to falling out of bed a few weeks ago. Manage pain 5. DVT prophylaxis: Lovenox 6. GI prophylaxis: None The patient is a DO NOT RESUSCITATE. Time spent on admission orders and patient care approximate 45 minutes  Harrie Foreman, MD 11/10/2016, 5:29 AM

## 2016-11-10 NOTE — ED Provider Notes (Signed)
-----------------------------------------   12:57 AM on 11/10/2016 -----------------------------------------   Blood pressure 126/81, pulse 70, temperature 97.8 F (36.6 C), temperature source Oral, resp. rate 14, height  (1.626 m), weight 122 lb 6.4 oz (55.5 kg), SpO2 96 %.  Assuming care from Dr. Lenard Lance.  In short, Sherry Cherry is a 83 y.o. female with a chief complaint of Altered Mental Status .  Refer to the original H&P for additional details.  The current plan of care is to follow up the CT head results and the urinalysis.   Clinical Course as of Nov 11 55  Tue Nov 10, 2016  0026 1. 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 2. Chronic stable ovoid hyperdensity likely representing benign calcification in the left anterior insular cortex. 3. Chronic small vessel ischemic disease of periventricular white matter with atrophy. 4. No acute intracranial abnormality.   CT Head Wo Contrast [AW]    Clinical Course User Index [AW] Rebecka Apley, MD   The nurse reports that they were unable to catheter the patient. I did go in to speak with the family to tell them that her CT scan was unremarkable aside from some chronic changes. The plan was to send her home with the patient's family reports that she is not at her baseline. They report that at 5 PM she was sitting eating dinner and was speaking clearly. They report that she typically will speak clearly and can care for herself. The patient's daughter and granddaughter state that this is not her all. She is at an assisted living facility and in her state right now is unable to care for herself in that capacity. Since the patient has more changes than was previously described I will admit her to the hospitalist service for further evaluation and appropriate placement.   Rebecka Apley, MD 11/10/16 [phone]

## 2016-11-10 NOTE — Evaluation (Signed)
Clinical/Bedside Swallow Evaluation Patient Details  Name: Sherry Cherry MRN: [id number] Date of Birth: 09/25/1916  Today's Date: 11/10/2016 Time: SLP Start Time (ACUTE ONLY): 1500 SLP Stop Time (ACUTE ONLY): 1600 SLP Time Calculation (min) (ACUTE ONLY): 60 min  Past Medical History:  Past Medical History:  Diagnosis Date  . Dementia    Past Surgical History: History reviewed. No pertinent surgical history. HPI:      Assessment / Plan / Recommendation Clinical Impression  Pt seen for swallowing assessment this PM. Pt was in a fetal position but allowed for staff to move her into a more upright position; kyphosis noted. Pt was nonverbal and did not make attempts to verbally communicate/phonate. She was presented w/ oral care and stimulation using oral swabs initially which she responded to w/ an agitated look turning her head away. She did not open mouth for accepting of the swab w/ verbal/tactile stimulation. PO trials of ice chips and a small tsp of water were attempted. These trials were presented at her mouth/lips to which she responded in a similar manner as w/ the oral swabs. Pt did not open mouth to respond to, or accept, po trials. Pt's eyes were most often closed during the session. Of note, pt drooled min+ saliva initially during the session which she had been orally holding(suspected).  BSE revealed pt has significantly declined Cognitive status and overall decreased awareness for task of swallowing. This greatly increases her risk for aspiration w/ any oral intake. Recommend continue NPO status w/ alternative means for any medications(ie IV). Frequent oral care for oral hygiene and stimulation. ST services will f/u tomorrow AM for ongoing assessment of appropriateness/safety of oral intake. NSG updated on pt's status and recommendations; agreed. SLP Visit Diagnosis: Dysphagia, oropharyngeal phase (R13.12)    Aspiration Risk  Severe aspiration risk    Diet Recommendation  NPO w/  frequent oral care; aspiration precautions  Medication Administration: Via alternative means    Other  Recommendations Recommended Consults:  (Palliative Care consult) Oral Care Recommendations: Oral care QID;Staff/trained caregiver to provide oral care (aspiration precautions) Other Recommendations:  (TBD)   Follow up Recommendations  (TBD)      Frequency and Duration min 3x week  2 weeks       Prognosis Prognosis for Safe Diet Advancement: Guarded Barriers to Reach Goals: Cognitive deficits;Severity of deficits (baseline Dementia)      Swallow Study   General Date of Onset: 11/09/16 Type of Study: Bedside Swallow Evaluation Previous Swallow Assessment: none noted Diet Prior to this Study:  (unknown) Temperature Spikes Noted: No (wbc 5/5) Respiratory Status: Nasal cannula (has been on 2 liters) History of Recent Intubation: No Behavior/Cognition: Lethargic/Drowsy;Confused;Doesn't follow directions (nonverbal) Oral Cavity Assessment:  (unable to assess d/t Cognition; drooling, saliva) Oral Care Completed by SLP: Yes Oral Cavity - Dentition: Dentures, top (no bottom dentition) Vision:  (n/a) Self-Feeding Abilities: Total assist Patient Positioning: Upright in bed (kyphosis noted) Baseline Vocal Quality:  (nonverbal) Volitional Cough: Cognitively unable to elicit Volitional Swallow: Unable to elicit    Oral/Motor/Sensory Function Overall Oral Motor/Sensory Function: Mild impairment Facial ROM: Reduced right (min) Facial Symmetry: Abnormal symmetry right (min) Facial Strength: Reduced right (min) Lingual ROM:  (CNT) Lingual Symmetry:  (appeared wfl at rest) Lingual Strength:  (CNT) Velum:  (CNT) Mandible:  (CNT)   Ice Chips Ice chips: Impaired Presentation:  (fed; 3 trials at lips and anteriorly placed in buccal area) Oral Phase Impairments: Poor awareness of bolus;Reduced labial seal;Reduced lingual movement/coordination Oral Phase Functional Implications:  Right  anterior spillage;Left anterior spillage (no attempts to accept or respond to orally) Pharyngeal Phase Impairments:  (delayed swallow during appreciated x1 w/ attempts)   Thin Liquid Thin Liquid: Impaired Presentation: Spoon (fed; attempted 1 trial) Oral Phase Impairments: Poor awareness of bolus (turned head and spit/blew) Other Comments: did not accept bolus    Nectar Thick Nectar Thick Liquid: Not tested   Honey Thick Honey Thick Liquid: Not tested   Puree Puree: Not tested   Solid   GO   Solid: Not tested         Jerilynn Som, MS, CCC-SLP Watson,Katherine 11/10/2016,6:02 PM

## 2016-11-10 NOTE — ED Notes (Signed)
Pt's family left phone numbers to call with any updates on the patient: Arvid Right, Daughter [phone]; Sebastian Ache [phone]; Call Wynona Canes first, she is has the medical power of attorney

## 2016-11-10 NOTE — Consult Note (Signed)
Reason for Consult: confusion  Referring Physician: Dr. Allena Katz   CC: AMS  HPI: Sherry Cherry is an 83 y.o. female dementia presents to the emergency department due to decreased responsiveness. The patient lives at assisted living facility [facility]. She usually ambulatory walker and is talkative with family and staff members. However pt seemed lethargic and noncommunicative. Pt appears aphasic and R facial droop which is unclear of timing.   Past Medical History:  Diagnosis Date  . Dementia     No past surgical history on file.  Family History  Problem Relation Age of Onset  . Hypertension Other     Social History:  has no tobacco, alcohol, and drug history on file.  Allergies  Allergen Reactions  . Macrobid [Nitrofurantoin Macrocrystal]     Medications: I have reviewed the patient's current medications.  ROS: Unable to obtain as pt is not verbal.   Physical Examination: Blood pressure 116/68, pulse (!) 57, temperature 98 F (36.7 C), temperature source Oral, resp. rate 18, height  (1.626 m), weight 55.5 kg (122 lb 6.4 oz), SpO2 95 %.    Neurological Examination   Mental Status: Awake, moves extremities symmetrically Cranial Nerves: II: Discs flat bilaterally; Visual fields grossly normal, pupils equal, round, reactive to light and accommodation III,IV, VI: ptosis not present, extra-ocular motions intact bilaterally V,VII: smile symmetric, facial light touch sensation normal bilaterally VIII: hearing normal bilaterally IX,X: gag reflex present XI: bilateral shoulder shrug XII: midline tongue extension Motor: Withdraws from pain symmetrically and moving all her extremities.   Tone and bulk:normal tone throughout; no atrophy noted Sensory: not able to access Deep Tendon Reflexes: not tested Plantars: Right: downgoing   Left: downgoing Cerebellar: Unable to access  Gait: not tested      Laboratory Studies:   Basic Metabolic Panel:  Recent  Labs Lab 11/09/16 2247  NA 130*  K 3.5  CL 101  CO2 23  GLUCOSE 96  BUN 16  CREATININE 0.66  CALCIUM 8.3*    Liver Function Tests:  Recent Labs Lab 11/09/16 2247  AST 14*  ALT 30  ALKPHOS 85  BILITOT 0.7  PROT 6.1*  ALBUMIN 2.8*   No results for input(s): LIPASE, AMYLASE in the last 168 hours. No results for input(s): AMMONIA in the last 168 hours.  CBC:  Recent Labs Lab 11/09/16 2247  WBC 7.7  HGB 12.1  HCT 36.3  MCV 84.9  PLT 218    Cardiac Enzymes: No results for input(s): CKTOTAL, CKMB, CKMBINDEX, TROPONINI in the last 168 hours.  BNP: Invalid input(s): POCBNP  CBG: No results for input(s): GLUCAP in the last 168 hours.  Microbiology: No results found for this or any previous visit.  Coagulation Studies: No results for input(s): LABPROT, INR in the last 72 hours.  Urinalysis:  Recent Labs Lab 11/10/16 0850  COLORURINE STRAW*  LABSPEC 1.005  PHURINE 7.0  GLUCOSEU NEGATIVE  HGBUR NEGATIVE  BILIRUBINUR NEGATIVE  KETONESUR NEGATIVE  PROTEINUR NEGATIVE  NITRITE NEGATIVE  LEUKOCYTESUR NEGATIVE    Lipid Panel:     Component Value Date/Time   CHOL 155 11/10/2016 1029   TRIG 52 11/10/2016 1029   HDL 52 11/10/2016 1029   CHOLHDL 3.0 11/10/2016 1029   VLDL 10 11/10/2016 1029   LDLCALC 93 11/10/2016 1029    HgbA1C: No results found for: HGBA1C  Urine Drug Screen:  No results found for: LABOPIA, COCAINSCRNUR, LABBENZ, AMPHETMU, THCU, LABBARB  Alcohol Level: No results for input(s): ETH in the last  168 hours.  Other results: Imaging: Ct Head Wo Contrast  Result Date: 11/10/2016 CLINICAL DATA:  Dementia, recent fall. EXAM: CT HEAD WITHOUT CONTRAST TECHNIQUE: Contiguous axial images were obtained from the base of the skull through the vertex without intravenous contrast. COMPARISON:  03/02/2008 CT FINDINGS: Brain: Chronic stable ovoid hyperdensity dating back to 2009 consistent with a benign finding, likely a focus of calcification in the  left anterior insular cortex. Left inferior cerebellar encephalomalacia from chronic infarction. No acute intracranial hemorrhage, midline shift or edema. Superficial and central atrophy. Chronic small vessel ischemic disease of periventricular white matter. No extra-axial fluid collections. Vascular: Atherosclerosis of the carotid siphons bilaterally and left vertebral artery. No hyperdense vessels. Skull: Normal. Negative for fracture or focal lesion. Clear mastoids. Sinuses/Orbits: Clear paranasal sinuses. Status post right lens replacement. Intact globes and orbits bilaterally. Other: None IMPRESSION: 1. 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 2. Chronic stable ovoid hyperdensity likely representing benign calcification in the left anterior insular cortex. 3. Chronic small vessel ischemic disease of periventricular white matter with atrophy. 4. No acute intracranial abnormality. Electronically Signed   By: Tollie Eth M.D.   On: 11/10/2016 00:16   US Carotid Bilateral (at [facility] And [facility] Only)  Result Date: 11/10/2016 CLINICAL DATA:  83 year old female with a history of altered mental status. Cardiovascular risk factors include none given. EXAM: BILATERAL CAROTID DUPLEX ULTRASOUND TECHNIQUE: Wallace Cullens scale imaging, color Doppler and duplex ultrasound were performed of bilateral carotid and vertebral arteries in the neck. COMPARISON:  No prior duplex FINDINGS: Criteria: Quantification of carotid stenosis is based on velocity parameters that correlate the residual internal carotid diameter with NASCET-based stenosis levels, using the diameter of the distal internal carotid lumen as the denominator for stenosis measurement. The following velocity measurements were obtained: RIGHT ICA:  Systolic 53 cm/sec, Diastolic 6 cm/sec CCA:  49 cm/sec SYSTOLIC ICA/CCA RATIO:  1.1 ECA:  37 cm/sec LEFT ICA:  Systolic 54 cm/sec, Diastolic 12 cm/sec CCA:  54 cm/sec SYSTOLIC ICA/CCA RATIO:  1.0 ECA:  61 cm/sec  Right Brachial SBP: Not acquired Left Brachial SBP: Not acquired RIGHT CAROTID ARTERY: No significant calcifications of the right common carotid artery. Intermediate waveform maintained. Heterogeneous and partially calcified plaque at the right carotid bifurcation. No significant lumen shadowing. Low resistance waveform of the right ICA. No significant tortuosity. RIGHT VERTEBRAL ARTERY: Antegrade flow with low resistance waveform. LEFT CAROTID ARTERY: No significant calcifications of the left common carotid artery. Intermediate waveform maintained. Heterogeneous and partially calcified plaque at the left carotid bifurcation without significant lumen shadowing. Low resistance waveform of the left ICA. No significant tortuosity. LEFT VERTEBRAL ARTERY:  Antegrade flow with low resistance waveform. IMPRESSION: Color duplex indicates minimal heterogeneous and calcified plaque, with no hemodynamically significant stenosis by duplex criteria in the extracranial cerebrovascular circulation. Signed, Yvone Neu. Loreta Ave, DO [facility] Radiology Electronically Signed   By: Gilmer Mor D.O.   On: 11/10/2016 11:42   Dg Chest Portable 1 View  Result Date: 11/09/2016 CLINICAL DATA:  Severe dementia, recent fall. EXAM: PORTABLE CHEST 1 VIEW COMPARISON:  03/31/2008 CXR, 11/25/2004 chest CT FINDINGS: Stable cardiomegaly with aortic atherosclerosis. Mild interstitial edema is noted bilaterally with small focus of pulmonary airspace disease and atelectasis at the right lung base laterally adjacent to several rib fractures involving the right fifth through seventh ribs. No pneumothorax. Minimal blunting of left costophrenic and pole consistent with a small left effusion. Soft tissue density along the right paratracheal portion of the  mediastinum may represent an engorged azygos vein. Adenopathy is not entirely excluded. IMPRESSION: 1. Cardiomegaly with aortic atherosclerosis and mild  interstitial edema. Trace left pleural effusion with blunting of the left lateral costophrenic angle. 2. Subtle deformity of the right lateral fifth through seventh ribs may reflect nondisplaced rib fractures possibly recent given adjacent atelectasis. These are new since the 2009 comparison. Electronically Signed   By: Tollie Eth M.D.   On: 11/09/2016 22:50     Assessment/Plan:  83 y.o. female dementia presents to the emergency department due to decreased responsiveness. The patient lives at assisted living facility [facility]. She usually ambulatory walker and is talkative with family and staff members. However pt seemed lethargic and noncommunicative. Pt appears aphasic and R facial droop which is unclear of timing.   - Pt has chronic cerebellar infarct but it should not be contributing to aphasia and R facial droop - I suspect there is another subcortical L MCA infarct - as I understand family doesn't want aggressive care such as MRI - would consider repeating CTH tomorrow - US done - anti platelet therapy/statin - pt is DNR 11/10/2016, 11:52 AM

## 2016-11-10 NOTE — Progress Notes (Signed)
[employer] - [place] [facility]                                                                                                                                                                                  Patient Demographics   Sherry Cherry, is a 83 y.o. female, DOB - 04-06-1917, ZOX:[id number]  Admit date - 11/09/2016   Admitting Physician Arnaldo Natal, MD  Outpatient Primary MD for the patient is Danella Penton, MD   LOS - 0  Subjective: Patient is 83 year old brought in from an assisted living due to decreased responsiveness. Currently noncommunicative and is aphasic and right facial droop  Her daughter called to discuss her case with me    Review of Systems:   CONSTITUTIONAL:Patient aphasic unable to provide  Vitals:   Vitals:   11/10/16 0800 11/10/16 0817 11/10/16 0920 11/10/16 1141  BP: (!) 109/49 (!) 109/49 (!) 101/54 116/68  Pulse: 62 74 62 (!) 57  Resp:  Temp:   97.7 F (36.5 C) 98 F (36.7 C)  TempSrc:   Oral Oral  SpO2: 94% 97% 96% 95%  Weight:      Height:        Wt Readings from Last 3 Encounters:  11/09/16 122 lb 6.4 oz (55.5 kg)    No intake or output data in the 24 hours ending 11/10/16 1324  Physical Exam:   GENERAL: Pleasant-appearing in no apparent distress.  HEAD, EYES, EARS, NOSE AND THROAT: Atraumatic, normocephalic. . Pupils equal and reactive to light. Sclerae anicteric. No conjunctival injection. No oro-pharyngeal erythema.  NECK: Supple. There is no jugular venous distention. No bruits, no lymphadenopathy, no thyromegaly.  HEART: Regular rate and rhythm,. No murmurs, no rubs, no clicks.  LUNGS: Clear to auscultation bilaterally. No rales or rhonchi. No wheezes.  ABDOMEN: Soft, flat, nontender, nondistended. Has good bowel sounds. No hepatosplenomegaly appreciated.  EXTREMITIES: No evidence of any cyanosis, clubbing, or peripheral edema.  +2 pedal and radial pulses bilaterally.  NEUROLOGIC: The  patient is alert, awake, not oriented x3 and appears to have ruptured right upper extremity stiffening SKIN: Moist and warm with no rashes appreciated.  Psych: Not anxious, depressed LN: No inguinal LN enlargement    Antibiotics   Anti-infectives    None      Medications   Scheduled Meds: . aspirin  300 mg Rectal Daily   Or  . aspirin  325 mg Oral Daily  . docusate sodium  100 mg Oral BID  . enoxaparin (LOVENOX) injection  40 mg Subcutaneous Q24H  . sertraline  25 mg Oral Daily   Continuous Infusions: PRN Meds:.acetaminophen **OR** acetaminophen, morphine  injection, ondansetron **OR** ondansetron (ZOFRAN) IV, oxyCODONE-acetaminophen   Data Review:   Micro Results No results found for this or any previous visit (from the past 240 hour(s)).  Radiology Reports Ct Head Wo Contrast  Result Date: 11/10/2016 CLINICAL DATA:  Dementia, recent fall. EXAM: CT HEAD WITHOUT CONTRAST TECHNIQUE: Contiguous axial images were obtained from the base of the skull through the vertex without intravenous contrast. COMPARISON:  03/02/2008 CT FINDINGS: Brain: Chronic stable ovoid hyperdensity dating back to 2009 consistent with a benign finding, likely a focus of calcification in the left anterior insular cortex. Left inferior cerebellar encephalomalacia from chronic infarction. No acute intracranial hemorrhage, midline shift or edema. Superficial and central atrophy. Chronic small vessel ischemic disease of periventricular white matter. No extra-axial fluid collections. Vascular: Atherosclerosis of the carotid siphons bilaterally and left vertebral artery. No hyperdense vessels. Skull: Normal. Negative for fracture or focal lesion. Clear mastoids. Sinuses/Orbits: Clear paranasal sinuses. Status post right lens replacement. Intact globes and orbits bilaterally. Other: None IMPRESSION: 1. 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 2. Chronic stable ovoid hyperdensity likely  representing benign calcification in the left anterior insular cortex. 3. Chronic small vessel ischemic disease of periventricular white matter with atrophy. 4. No acute intracranial abnormality. Electronically Signed   By: Tollie Eth M.D.   On: 11/10/2016 00:16   US Carotid Bilateral (at [facility] And [facility] Only)  Result Date: 11/10/2016 CLINICAL DATA:  83 year old female with a history of altered mental status. Cardiovascular risk factors include none given. EXAM: BILATERAL CAROTID DUPLEX ULTRASOUND TECHNIQUE: Wallace Cullens scale imaging, color Doppler and duplex ultrasound were performed of bilateral carotid and vertebral arteries in the neck. COMPARISON:  No prior duplex FINDINGS: Criteria: Quantification of carotid stenosis is based on velocity parameters that correlate the residual internal carotid diameter with NASCET-based stenosis levels, using the diameter of the distal internal carotid lumen as the denominator for stenosis measurement. The following velocity measurements were obtained: RIGHT ICA:  Systolic 53 cm/sec, Diastolic 6 cm/sec CCA:  49 cm/sec SYSTOLIC ICA/CCA RATIO:  1.1 ECA:  37 cm/sec LEFT ICA:  Systolic 54 cm/sec, Diastolic 12 cm/sec CCA:  54 cm/sec SYSTOLIC ICA/CCA RATIO:  1.0 ECA:  61 cm/sec Right Brachial SBP: Not acquired Left Brachial SBP: Not acquired RIGHT CAROTID ARTERY: No significant calcifications of the right common carotid artery. Intermediate waveform maintained. Heterogeneous and partially calcified plaque at the right carotid bifurcation. No significant lumen shadowing. Low resistance waveform of the right ICA. No significant tortuosity. RIGHT VERTEBRAL ARTERY: Antegrade flow with low resistance waveform. LEFT CAROTID ARTERY: No significant calcifications of the left common carotid artery. Intermediate waveform maintained. Heterogeneous and partially calcified plaque at the left carotid bifurcation without significant lumen shadowing. Low resistance waveform of the left ICA. No significant  tortuosity. LEFT VERTEBRAL ARTERY:  Antegrade flow with low resistance waveform. IMPRESSION: Color duplex indicates minimal heterogeneous and calcified plaque, with no hemodynamically significant stenosis by duplex criteria in the extracranial cerebrovascular circulation. Signed, Yvone Neu. Loreta Ave, DO [facility] Radiology Electronically Signed   By: Gilmer Mor D.O.   On: 11/10/2016 11:42   Dg Chest Portable 1 View  Result Date: 11/09/2016 CLINICAL DATA:  Severe dementia, recent fall. EXAM: PORTABLE CHEST 1 VIEW COMPARISON:  03/31/2008 CXR, 11/25/2004 chest CT FINDINGS: Stable cardiomegaly with aortic atherosclerosis. Mild interstitial edema is noted bilaterally with small focus of pulmonary airspace disease and atelectasis at the right lung base laterally adjacent to several rib fractures involving the right fifth through seventh  ribs. No pneumothorax. Minimal blunting of left costophrenic and pole consistent with a small left effusion. Soft tissue density along the right paratracheal portion of the mediastinum may represent an engorged azygos vein. Adenopathy is not entirely excluded. IMPRESSION: 1. Cardiomegaly with aortic atherosclerosis and mild interstitial edema. Trace left pleural effusion with blunting of the left lateral costophrenic angle. 2. Subtle deformity of the right lateral fifth through seventh ribs may reflect nondisplaced rib fractures possibly recent given adjacent atelectasis. These are new since the 2009 comparison. Electronically Signed   By: Tollie Eth M.D.   On: 11/09/2016 22:50     CBC  Recent Labs Lab 11/09/16 2247  WBC 7.7  HGB 12.1  HCT 36.3  PLT 218  MCV 84.9  MCH 28.3  MCHC 33.3  RDW 16.6*    Chemistries   Recent Labs Lab 11/09/16 2247  NA 130*  K 3.5  CL 101  CO2 23  GLUCOSE 96  BUN 16  CREATININE 0.66  CALCIUM 8.3*  AST 14*  ALT 30  ALKPHOS 85  BILITOT 0.7    ------------------------------------------------------------------------------------------------------------------ estimated creatinine clearance is 33.9 mL/min (by C-G formula based on SCr of 0.66 mg/dL). ------------------------------------------------------------------------------------------------------------------ No results for input(s): HGBA1C in the last 72 hours. ------------------------------------------------------------------------------------------------------------------  Recent Labs  11/10/16 1029  CHOL 155  HDL 52  LDLCALC 93  TRIG 52  CHOLHDL 3.0   ------------------------------------------------------------------------------------------------------------------  Recent Labs  11/10/16 1029  TSH 2.691   ------------------------------------------------------------------------------------------------------------------ No results for input(s): VITAMINB12, FOLATE, FERRITIN, TIBC, IRON, RETICCTPCT in the last 72 hours.  Coagulation profile No results for input(s): INR, PROTIME in the last 168 hours.  No results for input(s): DDIMER in the last 72 hours.  Cardiac Enzymes No results for input(s): CKMB, TROPONINI, MYOGLOBIN in the last 168 hours.  Invalid input(s): CK ------------------------------------------------------------------------------------------------------------------ Invalid input(s): POCBNP    Assessment & Plan   1. Acute mental status: Lethargy;  This is related to an acute stroke. Physical exam I discussed the case with her daughter who states that she does not want her mom to undergo MRI due to her advanced age and will not change her management. We plan to do a swallow evaluation. If patient fails or swallow eval. then will need comfort care measures I will also ask palliative care team to assist in this.  2. Dementia: With behavioral disturbance. This is baseline for the patient. Continue Zoloft. If able to take 3. Pulmonary edema: Mild  interstitial edema. Echocardiogram has been ordered again the daughter is not interested in aggressive workup I will cancel the echocardiogram 4. Rib fractures: Nondisplaced; presumably secondary to falling out of bed a few weeks ago. Manage pain 5. DVT prophylaxis: Lovenox 6. GI prophylaxis: None     Code Status Orders        Start     Ordered   11/10/16 0952  Do not attempt resuscitation (DNR)  Continuous    Question Answer Comment  In the event of cardiac or respiratory ARREST Do not call a "code blue"   In the event of cardiac or respiratory ARREST Do not perform Intubation, CPR, defibrillation or ACLS   In the event of cardiac or respiratory ARREST Use medication by any route, position, wound care, and other measures to relive pain and suffering. May use oxygen, suction and manual treatment of airway obstruction as needed for comfort.      11/10/16 1191    Code Status History    Date Active Date Inactive Code Status Order ID  Comments User Context   This patient has a current code status but no historical code status.           Consults  neuro  DVT Prophylaxis  Lovenox    Lab Results  Component Value Date   PLT 218 11/09/2016     Time Spent in minutes   case discussed with her daughter  Greater than 50% of time spent in care coordination and counseling patient regarding the condition and plan of care.   Auburn Bilberry M.D on 11/10/2016 at 1:24 PM  Between 7am to 6pm - Pager - [phone]  After 6pm go to [email or web address] - password [email or web address] [facility]  [facility]   Office  [phone]

## 2016-11-10 NOTE — ED Notes (Signed)
Pt returned from CT °

## 2016-11-10 NOTE — ED Notes (Signed)
Attempted to collect urine sample via in-and-out cath with this RN and 3 other RNs; multiple attempts were unsuccessful in obtaining urine.

## 2016-11-10 NOTE — Progress Notes (Signed)
PT Cancellation Note  Patient Details Name: Sherry Cherry MRN: [id number] DOB: [birth year]   Cancelled Treatment:    Reason Eval/Treat Not Completed: Medical issues which prohibited therapy (consult received, chart reviewed, and per updated note the family may be seeking comfort care depending on SLP recommendation, will hold PT eval until further plan of care is established)   Riccardo Dubin Student PT 11/10/2016, 4:03 PM

## 2016-11-11 DIAGNOSIS — F0150 Vascular dementia without behavioral disturbance: Secondary | ICD-10-CM

## 2016-11-11 DIAGNOSIS — R4182 Altered mental status, unspecified: Secondary | ICD-10-CM

## 2016-11-11 DIAGNOSIS — R5383 Other fatigue: Secondary | ICD-10-CM

## 2016-11-11 DIAGNOSIS — Z7189 Other specified counseling: Secondary | ICD-10-CM

## 2016-11-11 DIAGNOSIS — Z515 Encounter for palliative care: Secondary | ICD-10-CM

## 2016-11-11 LAB — HEMOGLOBIN A1C
Hgb A1c MFr Bld: 5.1 % (ref 4.8–5.6)
Mean Plasma Glucose: 100 mg/dL

## 2016-11-11 MED ORDER — SODIUM CHLORIDE 0.9% FLUSH
3.0000 mL | Freq: Two times a day (BID) | INTRAVENOUS | Status: DC
  Administered 2016-11-12: 22:00:00 3 mL via INTRAVENOUS

## 2016-11-11 MED ORDER — BIOTENE DRY MOUTH MT LIQD: 15.0000 mL | OROMUCOSAL | Status: DC | PRN

## 2016-11-11 MED ORDER — GLYCOPYRROLATE 0.2 MG/ML IJ SOLN: 0.2000 mg | INTRAMUSCULAR | Status: DC | PRN

## 2016-11-11 MED ORDER — GLYCOPYRROLATE 1 MG PO TABS: 1.0000 mg | ORAL_TABLET | ORAL | Status: DC | PRN

## 2016-11-11 MED ORDER — SODIUM CHLORIDE 0.9 % IV SOLN: 250.0000 mL | INTRAVENOUS | Status: DC | PRN

## 2016-11-11 MED ORDER — MORPHINE SULFATE (CONCENTRATE) 10 MG/0.5ML PO SOLN: 5.0000 mg | ORAL | Status: DC | PRN

## 2016-11-11 MED ORDER — HALOPERIDOL LACTATE 5 MG/ML IJ SOLN: 0.5000 mg | INTRAMUSCULAR | Status: DC | PRN

## 2016-11-11 MED ORDER — POLYVINYL ALCOHOL 1.4 % OP SOLN: 1.0000 [drp] | Freq: Four times a day (QID) | OPHTHALMIC | Status: DC | PRN

## 2016-11-11 MED ORDER — MORPHINE SULFATE (PF) 2 MG/ML IV SOLN: 1.0000 mg | INTRAVENOUS | Status: DC | PRN

## 2016-11-11 MED ORDER — SODIUM CHLORIDE 0.9% FLUSH: 3.0000 mL | INTRAVENOUS | Status: DC | PRN

## 2016-11-11 MED ORDER — HALOPERIDOL LACTATE 2 MG/ML PO CONC: 0.5000 mg | ORAL | Status: DC | PRN

## 2016-11-11 MED ORDER — HALOPERIDOL 0.5 MG PO TABS: 0.5000 mg | ORAL_TABLET | ORAL | Status: DC | PRN

## 2016-11-11 MED ORDER — FLUCONAZOLE IN SODIUM CHLORIDE 100-0.9 MG/50ML-% IV SOLN
100.0000 mg | INTRAVENOUS | Status: DC
  Administered 2016-11-11: 12:00:00 100 mg via INTRAVENOUS
  Filled 2016-11-11 (×3): qty 50

## 2016-11-11 NOTE — Plan of Care (Signed)
Problem: SLP Dysphagia Goals Goal: Misc Dysphagia Goal Pt will safely tolerate po diet of least restrictive consistency w/ no overt s/s of aspiration noted by Staff/pt/family x3 sessions.    

## 2016-11-11 NOTE — Progress Notes (Signed)
Nutrition Brief Note  Patient identified to be seen for Malnutrition Screening Tool (MST). Chart reviewed and discussed with RN. Patient now transitioning to full comfort care. Prognosis < 2 weeks and family interested in transferring patient to hospice facility.   Per chart patient can have comfort feedings as desired. No nutrition interventions warranted at this time. Please consult RD as needed.   Helane Rima, MS, RD, LDN Pager: [phone] After Hours Pager: [phone]

## 2016-11-11 NOTE — Progress Notes (Signed)
  Speech Language Pathology Treatment: Dysphagia  Patient Details Name: Sherry Cherry MRN: [id number] DOB: 1917-03-05 Today's Date: 11/11/2016 Time: 0900-1000 SLP Time Calculation (min) (ACUTE ONLY): 60 min  Assessment / Plan / Recommendation Clinical Impression  Pt seen for ongoing swallowing assessment this AM. Pt was in a fetal position but allowed for staff to move her into a more upright position; kyphosis noted. Pt was mostly nonverbal but did make the attempt to say her name: "Sherry Cherry". But no other verbal communication to tell us to stop - she did use nonverbal means however. Noted a Right facial-labial droop today; slight wetness, drooling in Right corner of mouth.  She was presented w/ oral care and stimulation using oral swabs initially which she responded to w/ agitation: look turning her head away, putting washcloth to her mouth, swatting away the spoon w/ her Left hand. She did not open mouth for accepting of the swab w/ verbal/tactile stimulation. PO trials of ice cream, Nectar juice via tsp, and applesauce were attempted. These trials were presented at her mouth/lips to which she responded in a similar manner as w/ the oral swabs. Pt did not open mouth to respond to, or accept, po trials and allowed applesauce material to lay on her tongue and lips w/ no response of licking or accepting for swallowing; a pharyngeal swallow was not appreciated since she did not take any po trials orally.  PO trial attempts revealed pt has significantly declined Cognitive status and overall decreased awareness for task of swallowing. This greatly increases her risk for aspiration w/ any oral intake. Recommend continue NPO status w/ alternative means for any medications(ie IV). Frequent oral care for oral hygiene and stimulation - assess for possible oral thrush as pt's upper denture plate had white material on it, oral odor. ST services will f/u tomorrow AM for ongoing assessment of  appropriateness/safety of oral intake. NSG and Palliative Care Nurse updated on pt's status and recommendations; agreed.  HPI        SLP Plan  Continue with current plan of care (address possible oral thrush?).   NPO status; frequent oral care for oral stimulation/hygiene; continue w/ ongoing po trials to hopefully establish least restrictive diet; Palliative Care consult       Recommendations  Diet recommendations: NPO (w/ trials to attempt to establish an oral diet safely) Liquids provided via:  (TBD) Medication Administration: Via alternative means                General recommendations:  (Palliative Care following pt today for poc goals) Oral Care Recommendations: Oral care QID;Staff/trained caregiver to provide oral care Follow up Recommendations:  (TBD) SLP Visit Diagnosis: Dysphagia, oropharyngeal phase (R13.12) Plan: Continue with current plan of care (address possible oral thrush?)       GO               Jerilynn Som, MS, CCC-SLP Donte Lenzo 11/11/2016, 10:57 AM

## 2016-11-11 NOTE — Progress Notes (Signed)
New hospice home referral received from [place]. Sherry Cherry is a 83 year old woman form [employer], with a past medical history of Alzheimer's dementia and amitted to [facility] on 4/9 with altered mental status, and right facial droop, thought to be from a stroke. Head CT showed a previous stroke and chronic small vessel disease, nothing acute was found. Family has declined further any work up. Patient remains minimally responsive, not eating or drinking. Family met with Palliative Medicine PA Imogene Burn and have chosen to focus on her comfort with transfer to the hospice home when bed is available.  Patient seen lying in bed, no verbal response to voice, family at bedside. Writer met with patient's granddaughter Sherry Cherry Education officer, community) and daughter's Sherry Cherry and Sherry Cherry to initiate education regarding hospice services, philosophy and team approach to care with good understanding voiced by all. Questions answered, consents signed. Family and Hospital care team all aware there is currently no bed available at the hospice home for transfer today. Patient information faxed to referral. Will continue to follow through final disposition and provide support to patient and her family. Thank you. [name] RN, BSN, [place] and Palliative care of Gara Kroner, hospital liaison [phone] c

## 2016-11-11 NOTE — Progress Notes (Signed)
OT Cancellation Note  Patient Details Name: Sherry Cherry MRN: [id number] DOB: 1917-05-10   Cancelled Treatment:    Reason Eval/Treat Not Completed: Medical issues which prohibited therapy. Order received, chart reviewed. SLP recommending palliative care consult, family may be seeking comfort care. Will hold OT eval until further plan of care is established.  Richrd Prime, MPH, MS, OTR/L ascom [phone] 11/11/16, 8:32 AM

## 2016-11-11 NOTE — Progress Notes (Signed)
[place] at [facility]                                                                                                                                                                                  Patient Demographics   Sherry Cherry, is a 83 y.o. female, DOB - October 13, 1916, DHR:[id number]  Admit date - 11/09/2016   Admitting Physician Harrie Foreman, MD  Outpatient Primary MD for the patient is Rusty Aus, MD   LOS - 1  Subjective: Patient nonverbal, refused to eat   Review of Systems:   CONSTITUTIONAL:Patient aphasic unable to provide  Vitals:   Vitals:   11/11/16 0145 11/11/16 0157 11/11/16 0259 11/11/16 0635  BP:   134/61 123/65  Pulse: 62 (!) 56 (!) 54 (!) 54  Resp:   18 18  Temp:   98 F (36.7 C) 97.8 F (36.6 C)  TempSrc:   Oral Oral  SpO2: 91% 96% 98% 94%  Weight:      Height:        Wt Readings from Last 3 Encounters:  11/09/16 122 lb 6.4 oz (55.5 kg)     Intake/Output Summary (Last 24 hours) at 11/11/16 1212 Last data filed at 11/11/16 0639  Gross per 24 hour  Intake           743.17 ml  Output                0 ml  Net           743.17 ml    Physical Exam:   GENERAL: Pleasant-appearing in no apparent distress.  HEAD, EYES, EARS, NOSE AND THROAT: Atraumatic, normocephalic. . Pupils equal and reactive to light. Sclerae anicteric. No conjunctival injection. No oro-pharyngeal erythema.  NECK: Supple. There is no jugular venous distention. No bruits, no lymphadenopathy, no thyromegaly.  HEART: Regular rate and rhythm,. No murmurs, no rubs, no clicks.  LUNGS: Clear to auscultation bilaterally. No rales or rhonchi. No wheezes.  ABDOMEN: Soft, flat, nontender, nondistended. Has good bowel sounds. No hepatosplenomegaly appreciated.  EXTREMITIES: No evidence of any cyanosis, clubbing, or peripheral edema.  +2 pedal and radial pulses bilaterally.  NEUROLOGIC: The patient is alert, awake, not oriented x3 and appears to have    SKIN: Moist and warm with no rashes appreciated.  Psych: Not anxious, depressed LN: No inguinal LN enlargement    Antibiotics   Anti-infectives    Start     Dose/Rate Route Frequency Ordered Stop   11/11/16 1100  fluconazole (DIFLUCAN) IVPB 100 mg     100 mg 50 mL/hr over 60 Minutes Intravenous Every 24 hours 11/11/16 1008 11/18/16 1059  Medications   Scheduled Meds: . docusate sodium  100 mg Oral BID  . fluconazole (DIFLUCAN) IV  100 mg Intravenous Q24H  . mouth rinse  15 mL Mouth Rinse q12n4p  . sodium chloride flush  3 mL Intravenous Q12H   Continuous Infusions: . sodium chloride 50 mL/hr at 11/11/16 1211   PRN Meds:.sodium chloride, acetaminophen **OR** acetaminophen, antiseptic oral rinse, glycopyrrolate **OR** glycopyrrolate **OR** glycopyrrolate, haloperidol **OR** haloperidol **OR** haloperidol lactate, morphine injection, morphine CONCENTRATE **OR** morphine CONCENTRATE, ondansetron **OR** ondansetron (ZOFRAN) IV, polyvinyl alcohol, sodium chloride flush   Data Review:   Micro Results Recent Results (from the past 240 hour(s))  MRSA PCR Screening     Status: None   Collection Time: 11/10/16 12:27 PM  Result Value Ref Range Status   MRSA by PCR NEGATIVE NEGATIVE Final    Comment:        The GeneXpert MRSA Assay (FDA approved for NASAL specimens only), is one component of a comprehensive MRSA colonization surveillance program. It is not intended to diagnose MRSA infection nor to guide or monitor treatment for MRSA infections.     Radiology Reports Ct Head Wo Contrast  Result Date: 11/10/2016 CLINICAL DATA:  Dementia, recent fall. EXAM: CT HEAD WITHOUT CONTRAST TECHNIQUE: Contiguous axial images were obtained from the base of the skull through the vertex without intravenous contrast. COMPARISON:  03/02/2008 CT FINDINGS: Brain: Chronic stable ovoid hyperdensity dating back to 2009 consistent with a benign finding, likely a focus of calcification in the  left anterior insular cortex. Left inferior cerebellar encephalomalacia from chronic infarction. No acute intracranial hemorrhage, midline shift or edema. Superficial and central atrophy. Chronic small vessel ischemic disease of periventricular white matter. No extra-axial fluid collections. Vascular: Atherosclerosis of the carotid siphons bilaterally and left vertebral artery. No hyperdense vessels. Skull: Normal. Negative for fracture or focal lesion. Clear mastoids. Sinuses/Orbits: Clear paranasal sinuses. Status post right lens replacement. Intact globes and orbits bilaterally. Other: None IMPRESSION: 1. 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 2. Chronic stable ovoid hyperdensity likely representing benign calcification in the left anterior insular cortex. 3. Chronic small vessel ischemic disease of periventricular white matter with atrophy. 4. No acute intracranial abnormality. Electronically Signed   By: Ashley Royalty M.D.   On: 11/10/2016 00:16   US Carotid Bilateral (at [facility] Only)  Result Date: 11/10/2016 CLINICAL DATA:  83 year old female with a history of altered mental status. Cardiovascular risk factors include none given. EXAM: BILATERAL CAROTID DUPLEX ULTRASOUND TECHNIQUE: Pearline Cables scale imaging, color Doppler and duplex ultrasound were performed of bilateral carotid and vertebral arteries in the neck. COMPARISON:  No prior duplex FINDINGS: Criteria: Quantification of carotid stenosis is based on velocity parameters that correlate the residual internal carotid diameter with NASCET-based stenosis levels, using the diameter of the distal internal carotid lumen as the denominator for stenosis measurement. The following velocity measurements were obtained: RIGHT ICA:  Systolic 53 cm/sec, Diastolic 6 cm/sec CCA:  49 cm/sec SYSTOLIC ICA/CCA RATIO:  1.1 ECA:  37 cm/sec LEFT ICA:  Systolic 54 cm/sec, Diastolic 12 cm/sec CCA:  54 cm/sec SYSTOLIC ICA/CCA RATIO:  1.0 ECA:  61 cm/sec  Right Brachial SBP: Not acquired Left Brachial SBP: Not acquired RIGHT CAROTID ARTERY: No significant calcifications of the right common carotid artery. Intermediate waveform maintained. Heterogeneous and partially calcified plaque at the right carotid bifurcation. No significant lumen shadowing. Low resistance waveform of the right ICA. No significant tortuosity. RIGHT VERTEBRAL ARTERY: Antegrade flow with low resistance waveform. LEFT CAROTID ARTERY:  No significant calcifications of the left common carotid artery. Intermediate waveform maintained. Heterogeneous and partially calcified plaque at the left carotid bifurcation without significant lumen shadowing. Low resistance waveform of the left ICA. No significant tortuosity. LEFT VERTEBRAL ARTERY:  Antegrade flow with low resistance waveform. IMPRESSION: Color duplex indicates minimal heterogeneous and calcified plaque, with no hemodynamically significant stenosis by duplex criteria in the extracranial cerebrovascular circulation. Signed, Dulcy Fanny. Earleen Newport, DO [facility] Radiology Electronically Signed   By: Corrie Mckusick D.O.   On: 11/10/2016 11:42   Dg Chest Portable 1 View  Result Date: 11/09/2016 CLINICAL DATA:  Severe dementia, recent fall. EXAM: PORTABLE CHEST 1 VIEW COMPARISON:  03/31/2008 CXR, 11/25/2004 chest CT FINDINGS: Stable cardiomegaly with aortic atherosclerosis. Mild interstitial edema is noted bilaterally with small focus of pulmonary airspace disease and atelectasis at the right lung base laterally adjacent to several rib fractures involving the right fifth through seventh ribs. No pneumothorax. Minimal blunting of left costophrenic and pole consistent with a small left effusion. Soft tissue density along the right paratracheal portion of the mediastinum may represent an engorged azygos vein. Adenopathy is not entirely excluded. IMPRESSION: 1. Cardiomegaly with aortic atherosclerosis and mild  interstitial edema. Trace left pleural effusion with blunting of the left lateral costophrenic angle. 2. Subtle deformity of the right lateral fifth through seventh ribs may reflect nondisplaced rib fractures possibly recent given adjacent atelectasis. These are new since the 2009 comparison. Electronically Signed   By: Ashley Royalty M.D.   On: 11/09/2016 22:50     CBC  Recent Labs Lab 11/09/16 2247  WBC 7.7  HGB 12.1  HCT 36.3  PLT 218  MCV 84.9  MCH 28.3  MCHC 33.3  RDW 16.6*    Chemistries   Recent Labs Lab 11/09/16 2247  NA 130*  K 3.5  CL 101  CO2 23  GLUCOSE 96  BUN 16  CREATININE 0.66  CALCIUM 8.3*  AST 14*  ALT 30  ALKPHOS 85  BILITOT 0.7   ------------------------------------------------------------------------------------------------------------------ estimated creatinine clearance is 33.9 mL/min (by C-G formula based on SCr of 0.66 mg/dL). ------------------------------------------------------------------------------------------------------------------  Recent Labs  11/10/16 1029  HGBA1C 5.1   ------------------------------------------------------------------------------------------------------------------  Recent Labs  11/10/16 1029  CHOL 155  HDL 52  LDLCALC 93  TRIG 52  CHOLHDL 3.0   ------------------------------------------------------------------------------------------------------------------  Recent Labs  11/10/16 1029  TSH 2.691   ------------------------------------------------------------------------------------------------------------------ No results for input(s): VITAMINB12, FOLATE, FERRITIN, TIBC, IRON, RETICCTPCT in the last 72 hours.  Coagulation profile No results for input(s): INR, PROTIME in the last 168 hours.  No results for input(s): DDIMER in the last 72 hours.  Cardiac Enzymes No results for input(s): CKMB, TROPONINI, MYOGLOBIN in the last 168 hours.  Invalid input(s):  CK ------------------------------------------------------------------------------------------------------------------ Invalid input(s): [place]   1. Acute CVA family interested in palliative care and hospice home being evaluated 2. Dementia: With behavioral disturbance. This is baseline for the patient. Continue Zoloft. If able to take 3. Pulmonary edema: Mild interstitial edema. No further workup 4. Rib fractures: Nondisplaced; presumably secondary to falling out of bed a few weeks ago. Manage pain 5. DVT prophylaxis: Lovenox 6. GI prophylaxis: None     Code Status Orders        Start     Ordered   11/10/16 9381  Do not attempt resuscitation (DNR)  Continuous    Question Answer Comment  In the event of cardiac or respiratory ARREST Do not call  a "code blue"   In the event of cardiac or respiratory ARREST Do not perform Intubation, CPR, defibrillation or ACLS   In the event of cardiac or respiratory ARREST Use medication by any route, position, wound care, and other measures to relive pain and suffering. May use oxygen, suction and manual treatment of airway obstruction as needed for comfort.      11/10/16 0951    Code Status History    Date Active Date Inactive Code Status Order ID Comments User Context   This patient has a current code status but no historical code status.        I met with patient's daughter and granddaughter to discuss her case   Consults  neuro  DVT Prophylaxis  Lovenox    Lab Results  Component Value Date   PLT 218 11/09/2016     Time Spent in minutes  45mn case discussed with her daughter  Greater than 50% of time spent in care coordination and counseling patient regarding the condition and plan of care.   PDustin FlockM.D on 11/11/2016 at 12:12 PM  Between 7am to 6pm - Pager - [phone]  After 6pm go to [email or web address] - password [email or web address]   Office  [phone]

## 2016-11-11 NOTE — Consult Note (Signed)
Consultation Note Date: 11/11/2016   Patient Name: Sherry Cherry  DOB: 08-Feb-1917  MRN: [id number]  Age / Sex: 83 y.o., female  PCP: Sherry Aus, MD Referring Physician: Dustin Flock, MD  Reason for Consultation: Establishing goals of care  HPI/Patient Profile: 83 y.o. female with past medical history of dementia who lives at [place] who was admitted on 11/09/2016 with altered mental status from a likely stroke.  CT head showed a previous stroke and chronic small vessel disease but nothing acute.  Family did not want aggressive work up and refused MRI.   Clinical Assessment and Goals of Care: Patient is nonverbal. In the fetal position.  Speech therapy spoke with me. The patient is spitting back any attempts at feeding.  She also has yeast/thrush growing in her mouth and on her dentures.  After reviewing EPIC notes, imaging and labs and receiving report from the RN and Hospice liasion, I met at bedside with her Sherry Cherry and her Sherry Cherry.  Then we talked privately in the [place] per family's request.  Sherry Cherry was living at ALF, walking with a walker, conversing and eating just prior to admission.  Over the past two months she has been slowing down, eating less and talking less per her daughter.  I explained to the family that at this point she is non-verbal, non ambulatory, refusing any attempts at feeding.  The family accepted this information well.  We discussed disposition.  The patent's daughter Sherry Cherry is a physical therapist and understands that her mother could not participate in therapies.  Sherry Cherry had made her wishes clear in advance to [place].  She did not want to be "kept alive" - no aggressive measures.  When it is her time she just wants to go peacefully.  Sherry Cherry has a friend at [facility] ([place] and requests that her Sherry mother go there.  The family  asked me to touch base with the social worker and Hospice representative in order to expedite meetings with them.      Primary Decision Maker:  HCPOA, Sherry "Sherry Cherry"  Cherry    SUMMARY OF RECOMMENDATIONS     Code Status/Advance Care Planning:  DNR    Symptom Management:   Discontinue medications / interventions not related to comfort  Diflucan ordered IV for oral / pharyngeal thrush  Will add liquid morphine for pain / dyspnea  Haldol for agitation / delirium  Robinul for secretions  Comfort feeds if patient desires - purees  Additional Recommendations (Limitations, Scope, Preferences):  Full Comfort Care  Palliative Prophylaxis:   Delirium Protocol and Oral Care  Psycho-social/Spiritual:   Desire for further Chaplaincy support: requested.  Catholic  Prognosis:   < 2 weeks advanced age, not eating, speaking or ambulating  Discharge Planning: Hospice facility      Primary Diagnoses: Present on Admission: . Lethargic   I have reviewed the medical record, interviewed the patient and family, and examined the patient. The following aspects are pertinent.  Past Medical  History:  Diagnosis Date  . Dementia    Social History   Social History  . Marital status: Widowed    Spouse name: N/A  . Number of children: N/A  . Years of education: N/A   Social History Main Topics  . Smoking status: Never Smoker  . Smokeless tobacco: Never Used  . Alcohol use None  . Drug use: Unknown  . Sexual activity: Not Asked   Other Topics Concern  . None   Social History Narrative  . None   Family History  Problem Relation Age of Onset  . Hypertension Other    Scheduled Meds: . aspirin  300 mg Rectal Daily   Or  . aspirin  325 mg Oral Daily  . docusate sodium  100 mg Oral BID  . enoxaparin (LOVENOX) injection  40 mg Subcutaneous Q24H  . mouth rinse  15 mL Mouth Rinse q12n4p  . sertraline  25 mg Oral Daily   Continuous Infusions: . sodium  chloride 50 mL/hr at 11/10/16 1619   PRN Meds:.acetaminophen **OR** acetaminophen, morphine injection, ondansetron **OR** ondansetron (ZOFRAN) IV, oxyCODONE-acetaminophen Allergies  Allergen Reactions  . Macrobid [Nitrofurantoin Macrocrystal]    Review of Systems patient non verbal.  Physical Exam  Cachectic frail elderly female.  Awake, in fetal position, attempting to pull covers over her shoulder, non verbal CV rrr resp no distress Ext no edema   Vital Signs: BP 123/65 (BP Location: Left Arm)   Pulse (!) 54   Temp 97.8 F (36.6 C) (Oral)   Resp 18   Ht 5' 4"  (1.626 m)   Wt 55.5 kg (122 lb 6.4 oz)   SpO2 94%   BMI 21.01 kg/m  Pain Assessment: PAINAD       SpO2: SpO2: 94 % O2 Device:SpO2: 94 % O2 Flow Rate: .O2 Flow Rate (L/min): 1 L/min  IO: Intake/output summary:  Intake/Output Summary (Last 24 hours) at 11/11/16 0958 Last data filed at 11/11/16 5784  Gross per 24 hour  Intake           743.17 ml  Output                0 ml  Net           743.17 ml    LBM: Last BM Date:  (last BM unknown ) Baseline Weight: Weight: 55.5 kg (122 lb 6.4 oz) Most recent weight: Weight: 55.5 kg (122 lb 6.4 oz)     Palliative Assessment/Data:   Flowsheet Rows     Most Recent Value  Intake Tab  Referral Department  Hospitalist  Unit at Time of Referral  Med/Surg Unit  Palliative Care Primary Diagnosis  Pulmonary  Date Notified  11/10/16  Palliative Care Type  New Palliative care  Reason for referral  Clarify Goals of Care  Date of Admission  11/09/16  Date first seen by Palliative Care  11/11/16  # of days Palliative referral response time  1 Day(s)  # of days IP prior to Palliative referral  1  Clinical Assessment  Palliative Performance Scale Score  20%  Psychosocial & Spiritual Assessment  Palliative Care Outcomes  Patient/Family meeting held?  Yes  Who was at the meeting?  patient and HCPOA Sherry Cherry      Time In: 11:00 Time Out: 11:50 Time Total: 50  min. Greater than 50%  of this time was spent counseling and coordinating care related to the above assessment and plan.  Signed by: Sherry Burn, PA-C Palliative Medicine  Pager: [phone]  Please contact Palliative Medicine Team phone at [phone] for questions and concerns.  For individual provider: See Sherry Cherry

## 2016-11-11 NOTE — Progress Notes (Signed)
CH responded to an OR about Palliative Care. CH spoke with [facility] who stated that the Pt is under comfort care at this time. CH visited the room and Pt appeared to be sleeping. CH provided silent prayer at bedside.    11/11/16 1300  Clinical Encounter Type  Visited With Patient;Health care provider  Visit Type Initial;Spiritual support  Referral From Nurse  Consult/Referral To Chaplain  Spiritual Encounters  Spiritual Needs Prayer

## 2016-11-11 NOTE — Clinical Social Work Note (Signed)
Clinical Social Work Assessment  Patient Details  Name: Sherry Cherry MRN: [id number] Date of Birth: Oct 03, 1916  Date of referral:  11/11/16               Reason for consult:  Facility Placement, Discharge Planning, End of Life/Hospice                Permission sought to share information with:  Family Supports Permission granted to share information::  Yes, Verbal Permission Granted  Name::     Merilynn Finland  Relationship::  daughter  Contact Information:  [phone]  Housing/Transportation Living arrangements for the past 2 months:  Pace of Information:  Adult Children Patient Interpreter Needed:  None Criminal Activity/Legal Involvement Pertinent to Current Situation/Hospitalization:  No - Comment as needed Significant Relationships:  Adult Children, Other Family Members Lives with:  Facility Resident Do you feel safe going back to the place where you live?  No (Pt is unable to return due to level of care needs.) Need for family participation in patient care:  Yes (Comment)  Care giving concerns: No care giving concerns identified.   Social Worker assessment / plan:  CSW met with pt's daughters and granddaughter to address consult for residential hospice. CSW introduced herself and explained role of social work. CSW also explained the process of discharging to a residential haospice facility. Pt was admitted from [facility]. Pt has been evaluated by Palliative Care and the residential hospice has been recommended. CSW provided choice. Pt's [facility]. CSW made referral to [facility]. CSW also provided supportive counseling around end of life. CSW will continue to follow.   Employment status:  Retired Forensic scientist:  Medicare PT Recommendations:  Not assessed at this time [place] / Referral to community resources:  Other (Comment Required) (Residential Hospice)  Patient/Family's Response to  care:  Pt's family was appreciative of CSW support.   Patient/Family's Understanding of and Emotional Response to Diagnosis, Current Treatment, and Prognosis:  Pt's family is aware that pt prognosis is poor, and pt's care is not able to be managed at ALF>   Emotional Assessment Appearance:  Other (Comment Required Attitude/Demeanor/Rapport:  Other Affect (typically observed):  Other Orientation:  Oriented to Self Alcohol / Substance use:  Not Applicable Psych involvement (Current and /or in the community):  No (Comment)  Discharge Needs  Concerns to be addressed:  Grief and Loss Concerns, Adjustment to Illness Readmission within the last 30 days:  No Current discharge risk:  Terminally ill Barriers to Discharge:  Continued Medical Work up   [employer], LCSW 11/11/2016, 12:07 PM

## 2016-11-11 NOTE — Plan of Care (Signed)
Problem: Education: Goal: Knowledge of the prescribed therapeutic regimen will improve Outcome: Not Progressing Comfort care initiated. No signs of pain or discomfort. Repositioned pt qx2hrs. Pt is on the list for hospice facility.

## 2016-11-11 NOTE — Plan of Care (Signed)
Problem: Education: Goal: Knowledge of [facility] Education information/materials will improve Outcome: Progressing SpO2 90-91% on RA early in shift, placed on 1LNC.  VSS otherwise, free of falls during shift.  Somnolent throughout shift, arouses to pain or repeated verbal stimulation.  No signs of pain during shift.  Bed in low position, call bell within reach.  WCTM.

## 2016-11-12 MED ORDER — HALOPERIDOL 0.5 MG PO TABS: 0.5000 mg | ORAL_TABLET | ORAL | Status: AC | PRN

## 2016-11-12 MED ORDER — MORPHINE SULFATE (CONCENTRATE) 10 MG/0.5ML PO SOLN: 5.0000 mg | ORAL | Status: AC | PRN

## 2016-11-12 MED ORDER — GLYCOPYRROLATE 1 MG PO TABS: 1.0000 mg | ORAL_TABLET | ORAL | Status: AC | PRN

## 2016-11-12 NOTE — Discharge Instructions (Signed)
[facility] - [place] at [facility]  DIET:  As tolerates   DISCHARGE CONDITION:  Serious  ACTIVITY:  Activity as tolerated  OXYGEN:  Home Oxygen: No.   Oxygen Delivery: room air  DISCHARGE LOCATION:  Hospices home    ADDITIONAL DISCHARGE INSTRUCTION:   If you experience worsening of your admission symptoms, develop shortness of breath, life threatening emergency, suicidal or homicidal thoughts you must seek medical attention immediately by calling 911 or calling your MD immediately  if symptoms less severe.  You Must read complete instructions/literature along with all the possible adverse reactions/side effects for all the Medicines you take and that have been prescribed to you. Take any new Medicines after you have completely understood and accpet all the possible adverse reactions/side effects.   Please note  You were cared for by a hospitalist during your hospital stay. If you have any questions about your discharge medications or the care you received while you were in the hospital after you are discharged, you can call the unit and asked to speak with the hospitalist on call if the hospitalist that took care of you is not available. Once you are discharged, your primary care physician will handle any further medical issues. Please note that NO REFILLS for any discharge medications will be authorized once you are discharged, as it is imperative that you return to your primary care physician (or establish a relationship with a primary care physician if you do not have one) for your aftercare needs so that they can reassess your need for medications and monitor your lab values.

## 2016-11-12 NOTE — Progress Notes (Signed)
Family and hospital care team all made aware that there is no bed availability at the Hospice home today. Patient remains comfort care. She has not required any PRN medications in the last 24 hours. Will continue to follow through final discharge disposition. Thank you. Dayna Barker RN, BSN, [facility] [place], [facility] [phone] c

## 2016-11-12 NOTE — Plan of Care (Signed)
Problem: Education: Goal: Knowledge of [facility] [facility] Education information/materials will improve Outcome: Progressing Asleep majority of shift, arousable to voice.  No signs of pain during shift.  Tolerates q2h turns well.  No needs overnight.  Bed in low position, bed alarm on.  Call bell within reach, WCTM.

## 2016-11-12 NOTE — Progress Notes (Signed)
[place] at [facility]                                                                                                                                                                                  Patient Demographics   Sherry Cherry, is a 83 y.o. female, DOB - 12-Feb-1917, ZWC:[id number]  Admit date - 11/09/2016   Admitting Physician Harrie Foreman, MD  Outpatient Primary MD for the patient is Rusty Aus, MD   LOS - 2  Subjective: Patient  unable to communicate  Review of Systems:   CONSTITUTIONAL:Patient aphasic unable to provide  Vitals:   Vitals:   11/11/16 0157 11/11/16 0259 11/11/16 0635 11/12/16 0413  BP:  134/61 123/65 (!) 118/53  Pulse: (!) 56 (!) 54 (!) 54 68  Resp:  18 18 16   Temp:  98 F (36.7 C) 97.8 F (36.6 C) 98.1 F (36.7 C)  TempSrc:  Oral Oral Oral  SpO2: 96% 98% 94% 95%  Weight:      Height:        Wt Readings from Last 3 Encounters:  11/09/16 122 lb 6.4 oz (55.5 kg)     Intake/Output Summary (Last 24 hours) at 11/12/16 1151 Last data filed at 11/12/16 0145  Gross per 24 hour  Intake              965 ml  Output                0 ml  Net              965 ml    Physical Exam:   GENERAL: Pleasant-appearing in no apparent distress.  HEAD, EYES, EARS, NOSE AND THROAT: Atraumatic, normocephalic. . Pupils equal and reactive to light. Sclerae anicteric. No conjunctival injection. No oro-pharyngeal erythema.  NECK: Supple. There is no jugular venous distention. No bruits, no lymphadenopathy, no thyromegaly.  HEART: Regular rate and rhythm,. No murmurs, no rubs, no clicks.  LUNGS: Clear to auscultation bilaterally. No rales or rhonchi. No wheezes.  ABDOMEN: Soft, flat, nontender, nondistended. Has good bowel sounds. No hepatosplenomegaly appreciated.  EXTREMITIES: No evidence of any cyanosis, clubbing, or peripheral edema.  +2 pedal and radial pulses bilaterally.  NEUROLOGIC:Unable to follow commands nonverbal  upper extremity contracture  SKIN: Moist and warm with no rashes appreciated.  Psych: Not anxious, depressed LN: No inguinal LN enlargement    Antibiotics   Anti-infectives    Start     Dose/Rate Route Frequency Ordered Stop   11/11/16 1100  fluconazole (DIFLUCAN) IVPB 100 mg  Status:  Discontinued     100 mg 50 mL/hr over 60 Minutes Intravenous Every  24 hours 11/11/16 1008 11/12/16 1009      Medications   Scheduled Meds: . mouth rinse  15 mL Mouth Rinse q12n4p  . sodium chloride flush  3 mL Intravenous Q12H   Continuous Infusions:  PRN Meds:.sodium chloride, acetaminophen **OR** acetaminophen, antiseptic oral rinse, glycopyrrolate **OR** glycopyrrolate **OR** glycopyrrolate, haloperidol **OR** haloperidol **OR** haloperidol lactate, morphine injection, morphine CONCENTRATE **OR** morphine CONCENTRATE, ondansetron **OR** ondansetron (ZOFRAN) IV, polyvinyl alcohol, sodium chloride flush   Data Review:   Micro Results Recent Results (from the past 240 hour(s))  MRSA PCR Screening     Status: None   Collection Time: 11/10/16 12:27 PM  Result Value Ref Range Status   MRSA by PCR NEGATIVE NEGATIVE Final    Comment:        The GeneXpert MRSA Assay (FDA approved for NASAL specimens only), is one component of a comprehensive MRSA colonization surveillance program. It is not intended to diagnose MRSA infection nor to guide or monitor treatment for MRSA infections.     Radiology Reports Ct Head Wo Contrast  Result Date: 11/10/2016 CLINICAL DATA:  Dementia, recent fall. EXAM: CT HEAD WITHOUT CONTRAST TECHNIQUE: Contiguous axial images were obtained from the base of the skull through the vertex without intravenous contrast. COMPARISON:  03/02/2008 CT FINDINGS: Brain: Chronic stable ovoid hyperdensity dating back to 2009 consistent with a benign finding, likely a focus of calcification in the left anterior insular cortex. Left inferior cerebellar encephalomalacia from chronic  infarction. No acute intracranial hemorrhage, midline shift or edema. Superficial and central atrophy. Chronic small vessel ischemic disease of periventricular white matter. No extra-axial fluid collections. Vascular: Atherosclerosis of the carotid siphons bilaterally and left vertebral artery. No hyperdense vessels. Skull: Normal. Negative for fracture or focal lesion. Clear mastoids. Sinuses/Orbits: Clear paranasal sinuses. Status post right lens replacement. Intact globes and orbits bilaterally. Other: None IMPRESSION: 1. 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 2. Chronic stable ovoid hyperdensity likely representing benign calcification in the left anterior insular cortex. 3. Chronic small vessel ischemic disease of periventricular white matter with atrophy. 4. No acute intracranial abnormality. Electronically Signed   By: Ashley Royalty M.D.   On: 11/10/2016 00:16   US Carotid Bilateral (at [facility] Only)  Result Date: 11/10/2016 CLINICAL DATA:  83 year old female with a history of altered mental status. Cardiovascular risk factors include none given. EXAM: BILATERAL CAROTID DUPLEX ULTRASOUND TECHNIQUE: Pearline Cables scale imaging, color Doppler and duplex ultrasound were performed of bilateral carotid and vertebral arteries in the neck. COMPARISON:  No prior duplex FINDINGS: Criteria: Quantification of carotid stenosis is based on velocity parameters that correlate the residual internal carotid diameter with NASCET-based stenosis levels, using the diameter of the distal internal carotid lumen as the denominator for stenosis measurement. The following velocity measurements were obtained: RIGHT ICA:  Systolic 53 cm/sec, Diastolic 6 cm/sec CCA:  49 cm/sec SYSTOLIC ICA/CCA RATIO:  1.1 ECA:  37 cm/sec LEFT ICA:  Systolic 54 cm/sec, Diastolic 12 cm/sec CCA:  54 cm/sec SYSTOLIC ICA/CCA RATIO:  1.0 ECA:  61 cm/sec Right Brachial SBP: Not acquired Left Brachial SBP: Not acquired RIGHT CAROTID  ARTERY: No significant calcifications of the right common carotid artery. Intermediate waveform maintained. Heterogeneous and partially calcified plaque at the right carotid bifurcation. No significant lumen shadowing. Low resistance waveform of the right ICA. No significant tortuosity. RIGHT VERTEBRAL ARTERY: Antegrade flow with low resistance waveform. LEFT CAROTID ARTERY: No significant calcifications of the left common carotid artery. Intermediate waveform maintained. Heterogeneous and partially calcified plaque  at the left carotid bifurcation without significant lumen shadowing. Low resistance waveform of the left ICA. No significant tortuosity. LEFT VERTEBRAL ARTERY:  Antegrade flow with low resistance waveform. IMPRESSION: Color duplex indicates minimal heterogeneous and calcified plaque, with no hemodynamically significant stenosis by duplex criteria in the extracranial cerebrovascular circulation. Signed, Dulcy Fanny. Earleen Newport, DO [facility] Electronically Signed   By: Corrie Mckusick D.O.   On: 11/10/2016 11:42   Dg Chest Portable 1 View  Result Date: 11/09/2016 CLINICAL DATA:  Severe dementia, recent fall. EXAM: PORTABLE CHEST 1 VIEW COMPARISON:  03/31/2008 CXR, 11/25/2004 chest CT FINDINGS: Stable cardiomegaly with aortic atherosclerosis. Mild interstitial edema is noted bilaterally with small focus of pulmonary airspace disease and atelectasis at the right lung base laterally adjacent to several rib fractures involving the right fifth through seventh ribs. No pneumothorax. Minimal blunting of left costophrenic and pole consistent with a small left effusion. Soft tissue density along the right paratracheal portion of the mediastinum may represent an engorged azygos vein. Adenopathy is not entirely excluded. IMPRESSION: 1. Cardiomegaly with aortic atherosclerosis and mild interstitial edema. Trace left pleural effusion with blunting of the left  lateral costophrenic angle. 2. Subtle deformity of the right lateral fifth through seventh ribs may reflect nondisplaced rib fractures possibly recent given adjacent atelectasis. These are new since the 2009 comparison. Electronically Signed   By: Ashley Royalty M.D.   On: 11/09/2016 22:50     CBC  Recent Labs Lab 11/09/16 2247  WBC 7.7  HGB 12.1  HCT 36.3  PLT 218  MCV 84.9  MCH 28.3  MCHC 33.3  RDW 16.6*    Chemistries   Recent Labs Lab 11/09/16 2247  NA 130*  K 3.5  CL 101  CO2 23  GLUCOSE 96  BUN 16  CREATININE 0.66  CALCIUM 8.3*  AST 14*  ALT 30  ALKPHOS 85  BILITOT 0.7   ------------------------------------------------------------------------------------------------------------------ estimated creatinine clearance is 33.9 mL/min (by C-G formula based on SCr of 0.66 mg/dL). ------------------------------------------------------------------------------------------------------------------  Recent Labs  11/10/16 1029  HGBA1C 5.1   ------------------------------------------------------------------------------------------------------------------  Recent Labs  11/10/16 1029  CHOL 155  HDL 52  LDLCALC 93  TRIG 52  CHOLHDL 3.0   ------------------------------------------------------------------------------------------------------------------  Recent Labs  11/10/16 1029  TSH 2.691   ------------------------------------------------------------------------------------------------------------------ No results for input(s): VITAMINB12, FOLATE, FERRITIN, TIBC, IRON, RETICCTPCT in the last 72 hours.  Coagulation profile No results for input(s): INR, PROTIME in the last 168 hours.  No results for input(s): DDIMER in the last 72 hours.  Cardiac Enzymes No results for input(s): CKMB, TROPONINI, MYOGLOBIN in the last 168 hours.  Invalid input(s):  CK ------------------------------------------------------------------------------------------------------------------ Invalid input(s): [place]   1. Acute CVA Awaiting awaiting bed at hospice home no bed available today  2. Dementia: With behavioral disturbance. This is baseline for the patient. Continue Zoloft. If able to take 3. Pulmonary edema: Mild interstitial edema.No further workup  4. Rib fractures: Nondisplaced; presumably secondary to falling out of bed a few weeks ago. Manage pain 5. DVT prophylaxis: Lovenox 6. GI prophylaxis: None     Code Status Orders        Start     Ordered   11/10/16 7616  Do not attempt resuscitation (DNR)  Continuous    Question Answer Comment  In the event of cardiac or respiratory ARREST Do not call a "code blue"   In the event of cardiac or respiratory ARREST Do not perform  Intubation, CPR, defibrillation or ACLS   In the event of cardiac or respiratory ARREST Use medication by any route, position, wound care, and other measures to relive pain and suffering. May use oxygen, suction and manual treatment of airway obstruction as needed for comfort.      11/10/16 0951    Code Status History    Date Active Date Inactive Code Status Order ID Comments User Context   This patient has a current code status but no historical code status.        I met with patient's daughter and granddaughter to discuss her case   Consults  neuro  DVT Prophylaxis  Lovenox    Lab Results  Component Value Date   PLT 218 11/09/2016     Time Spent in minutes  69mn   Greater than 50% of time spent in care coordination and counseling patient regarding the condition and plan of care.   PDustin FlockM.D on 11/12/2016 at 11:51 AM  Between 7am to 6pm - Pager - [phone]  After 6pm go to [email or web address] - password [email or web address]   Office  [phone]

## 2016-11-12 NOTE — Progress Notes (Signed)
Cancellation Note:  Speech Therapy note - reviewed chart notes; consulted NSG and CM re: pt's status now. Pt has moved to a comfort care status and is awaiting a bed a the hospice home. Pt continues to sleep most of the shift but will arouse to voice/movement when turned. NSG to offer comfort care; suggested oral swabs initially, ice chips. Aspiration precautions. No further skilled ST services indicated at this time. NSG to reconsult if any need for education, change in status/poc. NSG agreed.   Jerilynn Som, MS, CCC-SLP

## 2016-11-13 NOTE — Plan of Care (Signed)
Problem: Education: Goal: Knowledge of the prescribed therapeutic regimen will improve Outcome: Progressing No needs overnight.  Pt compliant with q2h turns overall, refused 1 turn, pt pulled blanket over her and made verbalization sounding like "no."  No signs of pain during shift.  Alert for short periods, calm.  PIV flushes well, no issues.  Bed in low position, call bell within reach.  WCTM.

## 2016-11-13 NOTE — Progress Notes (Signed)
Follow up visit made to new hospice home referral. Patient seen lying bed, eyes open. Patient attempted to talk, speech was garbled, unable to understand. She di reach for and hold my hand. She appeared to be restless, settled with interaction and soothing touch. Hospice home bed Is available to transfer today.  Hospital care team all aware, HCPOA patient's granddaughter Wynona Canes made aware via telephone. Plan to transport to the hospice home this afternoon via EMS. Christine in agreement. Signed DNR in place in discharge packet. Hospice home is requesting transport at 2 pm, Clinical research associate to arrange and notify Wynona Canes at time of discharge. Thank you. Dayna Barker RN, BSN, [facility] [place], hospital Liaison [phone] c

## 2016-11-13 NOTE — Discharge Summary (Signed)
[facility] [place] at [facility], 83 y.o., DOB 10-25-1916, MRN [id number]. Admission date: 11/09/2016 Discharge Date 11/13/2016 Primary MD Danella Penton, MD Admitting Physician Arnaldo Natal, MD  Admission Diagnosis  Speech and language deficits [F80.9, R47.9] Altered mental status, unspecified altered mental status type [R41.82] Cerebrovascular accident (CVA), unspecified mechanism (HCC) [I63.9]  Discharge Diagnosis   Active Problems:   Acute cva   Altered mental status   Vascular dementia without behavioral disturbance   Palliative care encounter   Encounter for hospice care discussion   Possible CHF type unknown         Hospital Course   The patient with past medical history of dementia presents to the emergency department due to decreased responsiveness. The patient lives at assisted living facility [facility]. She usually ambulatory walker and is talkative with family and staff members.  Pt had ct of head which was negative.  Patients symptoms were consist with cva. Pt family due to her advance age didn't want further w/u. Pt able to communicate some. She is arranged to go to hospices home.              Consults  neuro  Significant Tests:  See full reports for all details     Ct Head Wo Contrast  Result Date: 11/10/2016 CLINICAL DATA:  Dementia, recent fall. EXAM: CT HEAD WITHOUT CONTRAST TECHNIQUE: Contiguous axial images were obtained from the base of the skull through the vertex without intravenous contrast. COMPARISON:  03/02/2008 CT FINDINGS: Brain: Chronic stable ovoid hyperdensity dating back to 2009 consistent with a benign finding, likely a focus of calcification in the left anterior insular cortex. Left inferior cerebellar encephalomalacia from chronic infarction. No acute intracranial hemorrhage, midline shift or edema. Superficial and central atrophy. Chronic small vessel ischemic disease of periventricular white  matter. No extra-axial fluid collections. Vascular: Atherosclerosis of the carotid siphons bilaterally and left vertebral artery. No hyperdense vessels. Skull: Normal. Negative for fracture or focal lesion. Clear mastoids. Sinuses/Orbits: Clear paranasal sinuses. Status post right lens replacement. Intact globes and orbits bilaterally. Other: None IMPRESSION: 1. 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 2. Chronic stable ovoid hyperdensity likely representing benign calcification in the left anterior insular cortex. 3. Chronic small vessel ischemic disease of periventricular white matter with atrophy. 4. No acute intracranial abnormality. Electronically Signed   By: Tollie Eth M.D.   On: 11/10/2016 00:16   US Carotid Bilateral (at [facility] Only)  Result Date: 11/10/2016 CLINICAL DATA:  83 year old female with a history of altered mental status. Cardiovascular risk factors include none given. EXAM: BILATERAL CAROTID DUPLEX ULTRASOUND TECHNIQUE: Wallace Cullens scale imaging, color Doppler and duplex ultrasound were performed of bilateral carotid and vertebral arteries in the neck. COMPARISON:  No prior duplex FINDINGS: Criteria: Quantification of carotid stenosis is based on velocity parameters that correlate the residual internal carotid diameter with NASCET-based stenosis levels, using the diameter of the distal internal carotid lumen as the denominator for stenosis measurement. The following velocity measurements were obtained: RIGHT ICA:  Systolic 53 cm/sec, Diastolic 6 cm/sec CCA:  49 cm/sec SYSTOLIC ICA/CCA RATIO:  1.1 ECA:  37 cm/sec LEFT ICA:  Systolic 54 cm/sec, Diastolic 12 cm/sec CCA:  54 cm/sec SYSTOLIC ICA/CCA RATIO:  1.0 ECA:  61 cm/sec Right Brachial SBP: Not acquired Left Brachial SBP: Not acquired RIGHT CAROTID ARTERY: No significant calcifications of the right common carotid artery. Intermediate waveform maintained. Heterogeneous and partially calcified plaque at the right  carotid bifurcation. No significant lumen shadowing. Low resistance waveform of the right ICA. No significant tortuosity. RIGHT VERTEBRAL ARTERY: Antegrade flow with low resistance waveform. LEFT CAROTID ARTERY: No significant calcifications of the left common carotid artery. Intermediate waveform maintained. Heterogeneous and partially calcified plaque at the left carotid bifurcation without significant lumen shadowing. Low resistance waveform of the left ICA. No significant tortuosity. LEFT VERTEBRAL ARTERY:  Antegrade flow with low resistance waveform. IMPRESSION: Color duplex indicates minimal heterogeneous and calcified plaque, with no hemodynamically significant stenosis by duplex criteria in the extracranial cerebrovascular circulation. Signed, Yvone Neu. Loreta Ave, DO [facility] Electronically Signed   By: Gilmer Mor D.O.   On: 11/10/2016 11:42   Dg Chest Portable 1 View  Result Date: 11/09/2016 CLINICAL DATA:  Severe dementia, recent fall. EXAM: PORTABLE CHEST 1 VIEW COMPARISON:  03/31/2008 CXR, 11/25/2004 chest CT FINDINGS: Stable cardiomegaly with aortic atherosclerosis. Mild interstitial edema is noted bilaterally with small focus of pulmonary airspace disease and atelectasis at the right lung base laterally adjacent to several rib fractures involving the right fifth through seventh ribs. No pneumothorax. Minimal blunting of left costophrenic and pole consistent with a small left effusion. Soft tissue density along the right paratracheal portion of the mediastinum may represent an engorged azygos vein. Adenopathy is not entirely excluded. IMPRESSION: 1. Cardiomegaly with aortic atherosclerosis and mild interstitial edema. Trace left pleural effusion with blunting of the left lateral costophrenic angle. 2. Subtle deformity of the right lateral fifth through seventh ribs may reflect nondisplaced rib fractures possibly recent given adjacent  atelectasis. These are new since the 2009 comparison. Electronically Signed   By: Tollie Eth M.D.   On: 11/09/2016 22:50       Today   Subjective:   Talbot Grumbling  Pt trying to speak  Objective:   Blood pressure (!) 118/53, pulse 68, temperature 98.1 F (36.7 C), temperature source Oral, resp. rate 16, height  (1.626 m), weight 122 lb 6.4 oz (55.5 kg), SpO2 95 %.  .  Intake/Output Summary (Last 24 hours) at 11/13/16 0959 Last data filed at 11/12/16 2211  Gross per 24 hour  Intake                3 ml  Output                0 ml  Net                3 ml    Exam VITAL SIGNS: Blood pressure (!) 118/53, pulse 68, temperature 98.1 F (36.7 C), temperature source Oral, resp. rate 16, height  (1.626 m), weight 122 lb 6.4 oz (55.5 kg), SpO2 95 %.  GENERAL:  83 y.o.-year-old patient lying in the bed with no acute distress.  EYES: Pupils equal, round, reactive to light and accommodation. No scleral icterus. Extraocular muscles intact.  HEENT: Head atraumatic, normocephalic. Oropharynx and nasopharynx clear.  NECK:  Supple, no jugular venous distention. No thyroid enlargement, no tenderness.  LUNGS: Normal breath sounds bilaterally, no wheezing, rales,rhonchi or crepitation. No use of accessory muscles of respiration.  CARDIOVASCULAR: S1, S2 normal. No murmurs, rubs, or gallops.  ABDOMEN: Soft, nontender, nondistended. Bowel sounds present. No organomegaly or mass.  EXTREMITIES: No pedal edema, cyanosis, or clubbing.  NEUROLOGIC:unable to move [place] PSYCHIATRIC: The patient is alert and oriented x 3.  SKIN: No obvious rash, lesion, or ulcer.   Data Review     CBC w Diff: Lab Results  Component Value Date   WBC 7.7 11/09/2016   HGB 12.1 11/09/2016   HCT 36.3 11/09/2016   PLT 218 11/09/2016   CMP: Lab Results  Component Value Date   NA 130 (L) 11/09/2016   K 3.5 11/09/2016   CL 101 11/09/2016   CO2 23 11/09/2016   BUN 16 11/09/2016   CREATININE 0.66 11/09/2016    PROT 6.1 (L) 11/09/2016   ALBUMIN 2.8 (L) 11/09/2016   BILITOT 0.7 11/09/2016   ALKPHOS 85 11/09/2016   AST 14 (L) 11/09/2016   ALT 30 11/09/2016  .  Micro Results Recent Results (from the past 240 hour(s))  MRSA PCR Screening     Status: None   Collection Time: 11/10/16 12:27 PM  Result Value Ref Range Status   MRSA by PCR NEGATIVE NEGATIVE Final    Comment:        The GeneXpert MRSA Assay (FDA approved for NASAL specimens only), is one component of a comprehensive MRSA colonization surveillance program. It is not intended to diagnose MRSA infection nor to guide or monitor treatment for MRSA infections.         Code Status Orders        Start     Ordered   11/11/16 1158  Do not attempt resuscitation (DNR)  Continuous    Question Answer Comment  In the event of cardiac or respiratory ARREST Do not call a "code blue"   In the event of cardiac or respiratory ARREST Do not perform Intubation, CPR, defibrillation or ACLS   In the event of cardiac or respiratory ARREST Use medication by any route, position, wound care, and other measures to relive pain and suffering. May use oxygen, suction and manual treatment of airway obstruction as needed for comfort.      11/11/16 1200    Code Status History    Date Active Date Inactive Code Status Order ID Comments User Context   11/10/2016  9:51 AM 11/11/2016 12:00 PM DNR [id number]  Arnaldo Natal, MD Inpatient    Advance Directive Documentation     Most Recent Value  Type of Advance Directive  Healthcare Power of Attorney  Pre-existing out of facility DNR order (yellow form or pink MOST form)  -  "MOST" Form in Place?  -            Discharge Medications   Allergies as of 11/13/2016      Reactions   Macrobid [nitrofurantoin Macrocrystal]       Medication List    STOP taking these medications   diclofenac sodium 1 % Gel Commonly known as:  VOLTAREN   sertraline 25 MG tablet Commonly known as:  ZOLOFT      TAKE these medications   acetaminophen 325 MG tablet Commonly known as:  TYLENOL Take 325 mg by mouth every 6 (six) hours as needed.   glycopyrrolate 1 MG tablet Commonly known as:  ROBINUL Take 1 tablet (1 mg total) by mouth every 4 (four) hours as needed (excessive secretions).   haloperidol 0.5 MG tablet Commonly known as:  HALDOL Take 1 tablet (0.5 mg total) by mouth every 4 (four) hours as needed for agitation (or delirium).   morphine CONCENTRATE 10 MG/0.5ML Soln concentrated solution Take 0.25 mLs (5 mg total) by mouth every 2 (two) hours as needed for moderate pain (or dyspnea).          Total Time in preparing paper work, data evaluation and todays exam - 35 minutes  Norissa Bartee  M.D on 11/13/2016 at 9:59 AM  [facility]   Office  [phone]

## 2016-11-13 NOTE — Care Management Important Message (Signed)
Important Message  Patient Details  Name: Sherry Cherry MRN: [id number] Date of Birth: 1917-01-01   Medicare Important Message Given:  Yes    Gwenette Greet, RN 11/13/2016, 8:17 AM

## 2016-11-13 NOTE — Clinical Social Work Note (Signed)
Pt is reayd for discharge today and will discharge to [facility]. Pt's family is agreeable and aware of discharge plan. Hospice Liaison will make arrangements for transfer. Discharge packet is prepared. CSW also notified Diamantina Monks regarding pt's dispo. CSW is signing off as no further needs identified.   Dede Query, MSW, LCSW  Clinical Social Worker  [phone]

## 2016-12-01 DEATH — deceased

## 2017-11-07 IMAGING — CT CT HEAD W/O CM
5 of 8 series · 17 of 47 positions shown, 18 images · non-contrast
Comparison: 03/02/2008 CT

CLINICAL DATA: Dementia, recent fall.

EXAM:
CT HEAD WITHOUT CONTRAST
TECHNIQUE: Contiguous axial images were obtained from the base of the skull
through the vertex without intravenous contrast.

[Series 2: head bone · axial · 0.41mm/px · z∈[+531,+643]mm · 8 of 70 slices shown]
[im 7/70  bone]
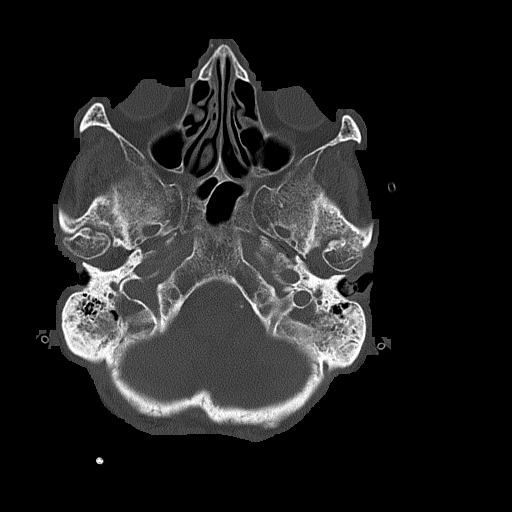
[im 14/70  bone]
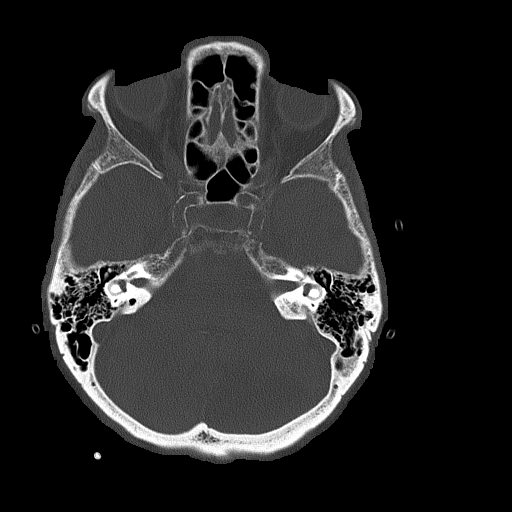
[im 21/70  bone]
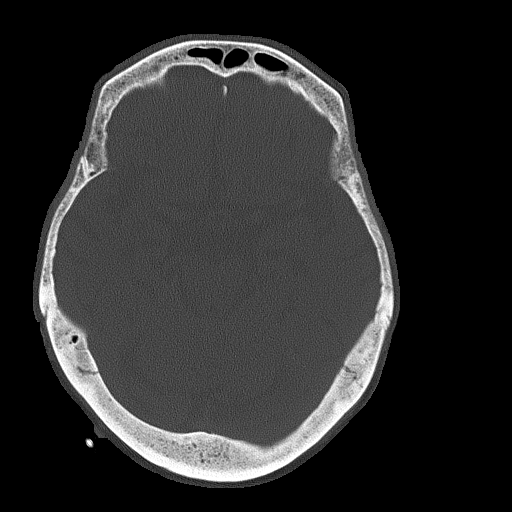
[im 28/70  bone]
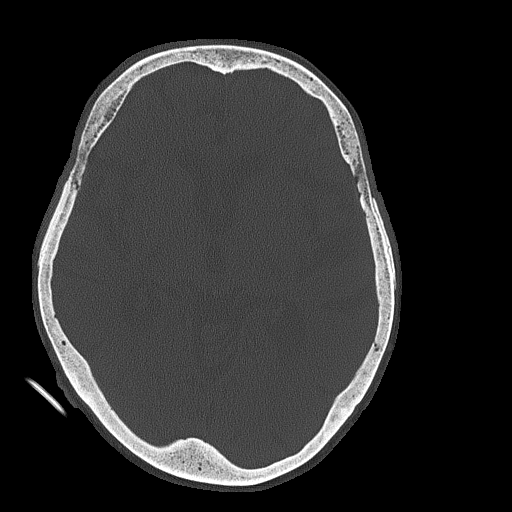
[im 42/70  bone]
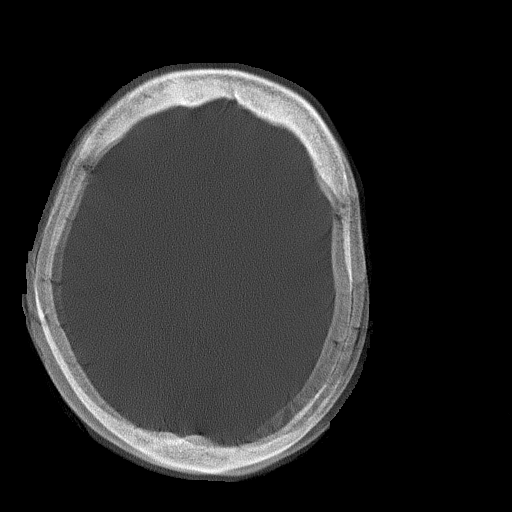
[im 49/70  bone]
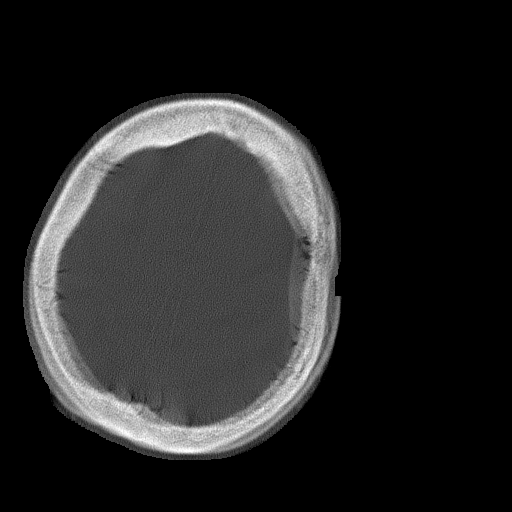
[im 56/70  bone]
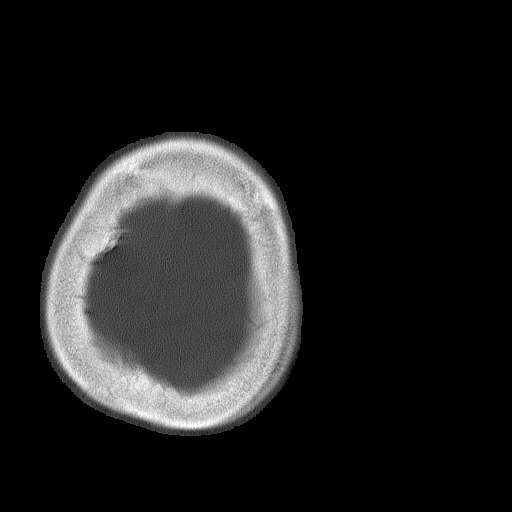
[im 63/70  bone]
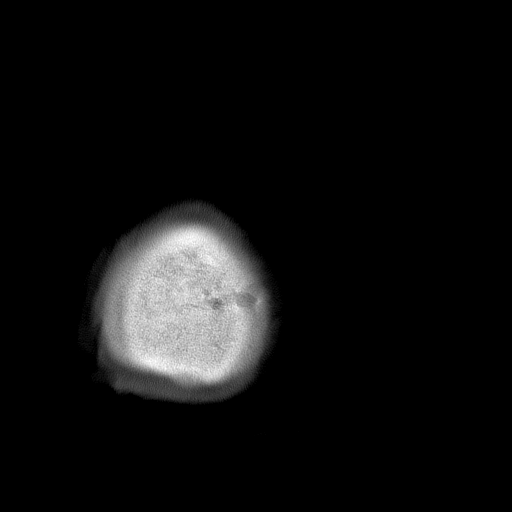

[Series 3: head wo · axial · 0.41mm/px · z∈[+564,+609]mm · 2 of 28 slices shown, 3 images]
[im 10/28  brain]
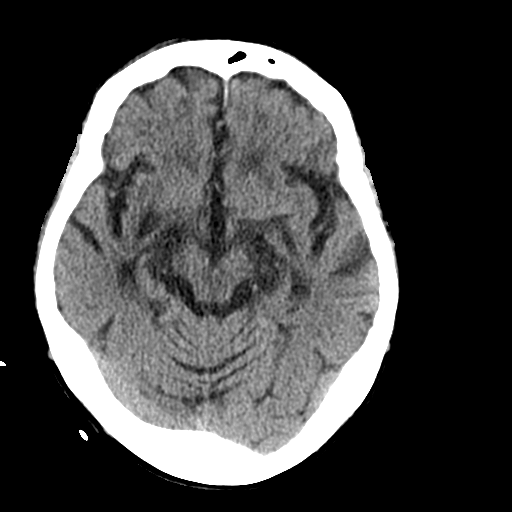
[im 10/28  bone]
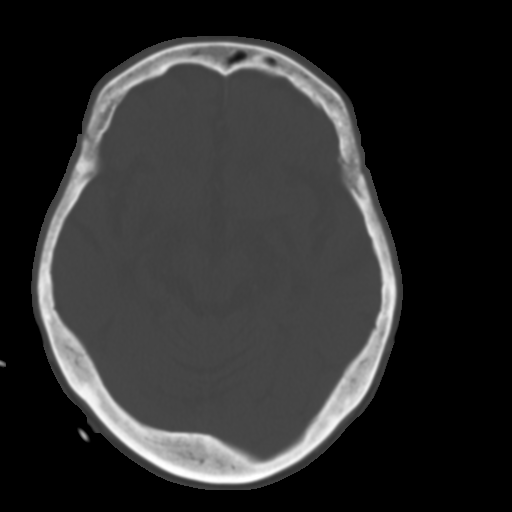
[im 19/28  brain]
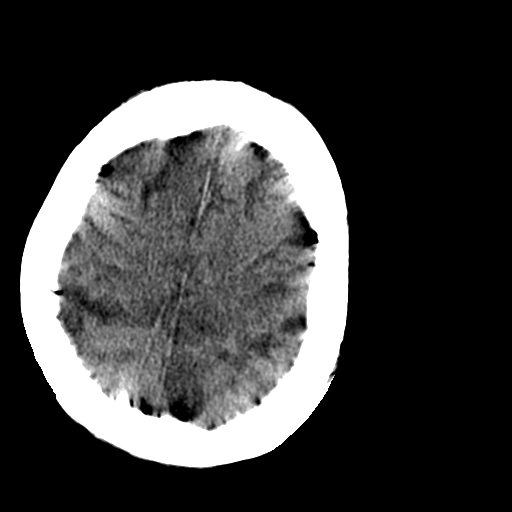

[Series 6: coronal soft tissue · coronal · 0.27mm/px · 3 of 63 slices shown]
[im 16/63  brain]
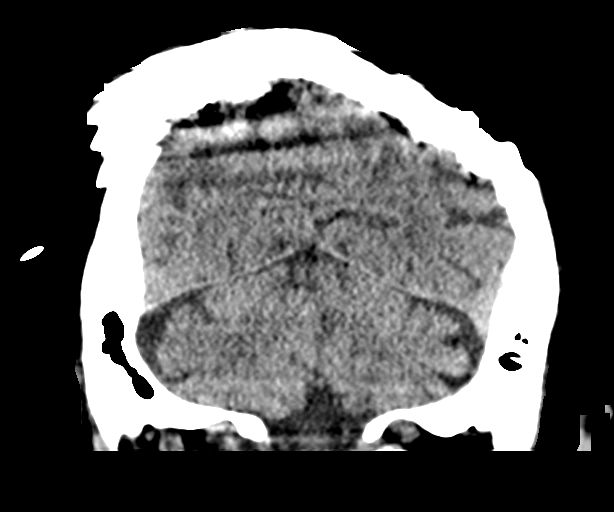
[im 32/63  brain]
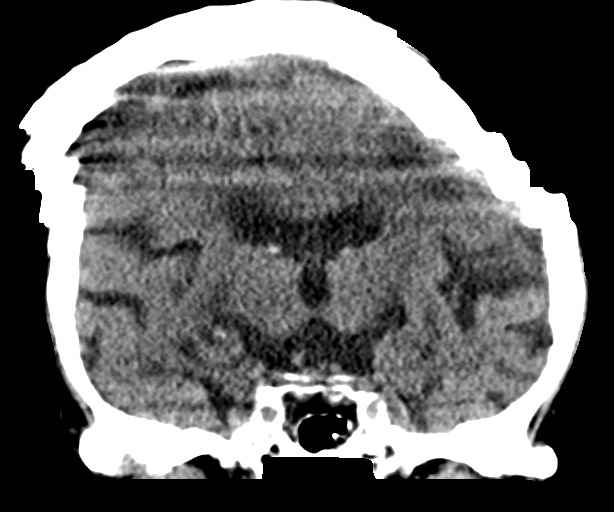
[im 47/63  brain]
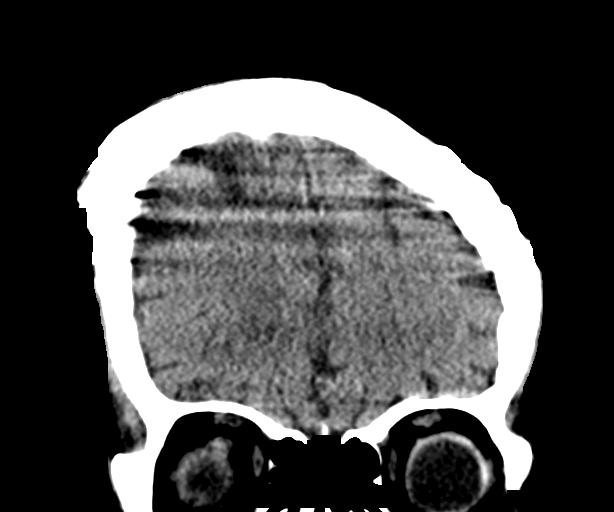

[Series 7: sagittal soft tissue · sagittal · 0.27mm/px · 2 of 56 slices shown]
[im 19/56  brain]
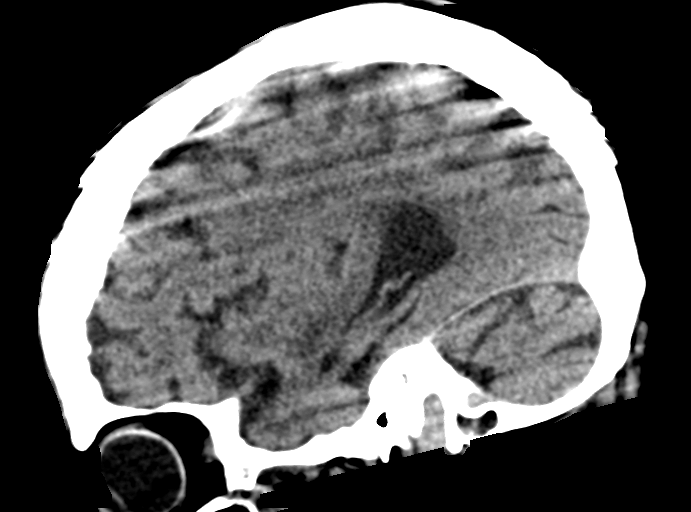
[im 37/56  brain]
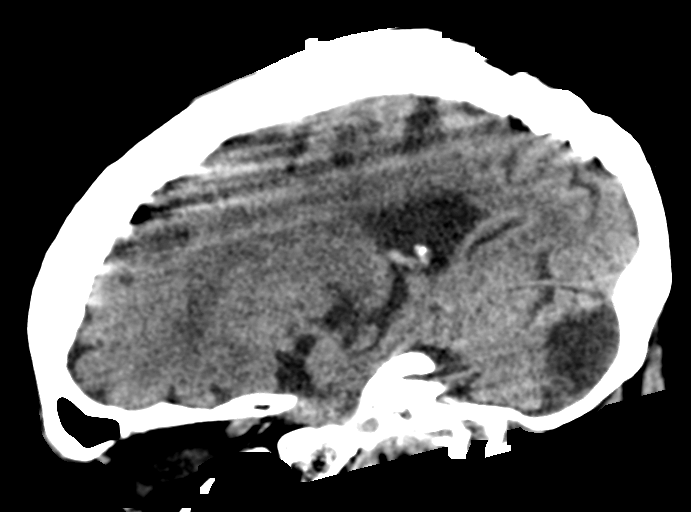

[Series 10: head wo- · axial · 0.45mm/px · z∈[+555,+600]mm · 2 of 28 slices shown]
[im 10/28  brain]
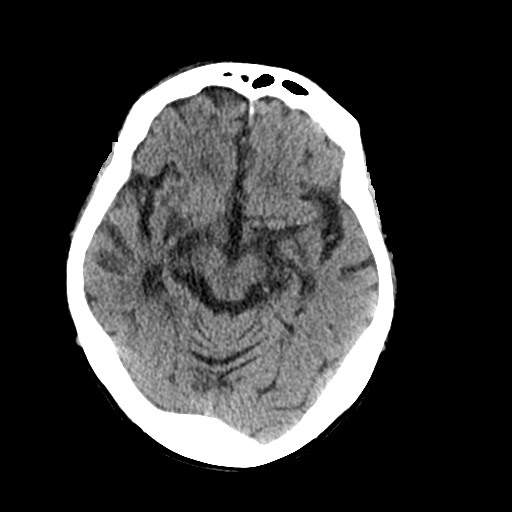
[im 19/28  brain]
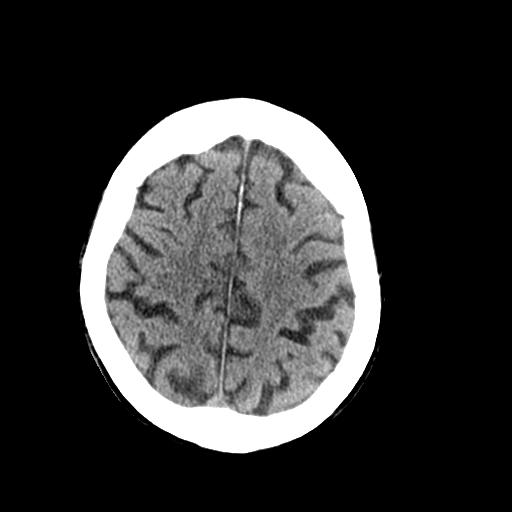

[17 of 47 positions shown; findings below may reference images not displayed]

FINDINGS: Brain: Chronic stable ovoid hyperdensity dating back to 6994
consistent with a benign finding, likely a focus of calcification in
the left anterior insular cortex. Left inferior cerebellar
encephalomalacia from chronic infarction. No acute intracranial
hemorrhage, midline shift or edema. Superficial and central atrophy.
Chronic small vessel ischemic disease of periventricular white
matter. No extra-axial fluid collections.

Vascular: Atherosclerosis of the carotid siphons bilaterally and
left vertebral artery. No hyperdense vessels.

Skull: Normal. Negative for fracture or focal lesion. Clear
mastoids.

Sinuses/Orbits: Clear paranasal sinuses. Status post right lens
replacement. Intact globes and orbits bilaterally.

Other: None
IMPRESSION: 1. Chronic left inferior cerebellar infarct within encephalomalacia.
2. Chronic stable ovoid hyperdensity likely representing benign
calcification in the left anterior insular cortex.
3. Chronic small vessel ischemic disease of periventricular white
matter with atrophy.
4. No acute intracranial abnormality.
